# Patient Record
Sex: Female | Born: 1990 | Race: White | Hispanic: No | Marital: Married | Smoking: Never smoker
Health system: Southern US, Community
[De-identification: ages and names within clinical notes are randomized; demographics above are authoritative.]

## PROBLEM LIST (undated history)

## (undated) DIAGNOSIS — N200 Calculus of kidney: Secondary | ICD-10-CM

## (undated) DIAGNOSIS — O280 Abnormal hematological finding on antenatal screening of mother: Secondary | ICD-10-CM

## (undated) DIAGNOSIS — E119 Type 2 diabetes mellitus without complications: Secondary | ICD-10-CM

## (undated) DIAGNOSIS — O24419 Gestational diabetes mellitus in pregnancy, unspecified control: Secondary | ICD-10-CM

## (undated) HISTORY — DX: Type 2 diabetes mellitus without complications: E11.9

## (undated) HISTORY — PX: FRACTURE SURGERY: SHX138

## (undated) HISTORY — PX: CHOLECYSTECTOMY: SHX55

---

## 2014-04-06 ENCOUNTER — Other Ambulatory Visit: Payer: Self-pay

## 2014-04-08 ENCOUNTER — Other Ambulatory Visit (HOSPITAL_COMMUNITY): Payer: Self-pay | Admitting: Obstetrics and Gynecology

## 2014-04-08 DIAGNOSIS — R772 Abnormality of alphafetoprotein: Secondary | ICD-10-CM

## 2014-04-15 ENCOUNTER — Ambulatory Visit (HOSPITAL_COMMUNITY)
Admission: RE | Admit: 2014-04-15 | Discharge: 2014-04-15 | Disposition: A | Discharge status: Home / Self Care | Payer: Medicaid Other | Source: Ambulatory Visit | Attending: Obstetrics and Gynecology | Admitting: Obstetrics and Gynecology

## 2014-04-15 ENCOUNTER — Encounter (HOSPITAL_COMMUNITY): Payer: Self-pay

## 2014-04-15 VITALS — BP 123/62 | HR 73 | Wt 204.5 lb

## 2014-04-15 DIAGNOSIS — R772 Abnormality of alphafetoprotein: Secondary | ICD-10-CM

## 2014-04-15 DIAGNOSIS — IMO0002 Reserved for concepts with insufficient information to code with codable children: Secondary | ICD-10-CM | POA: Diagnosis present

## 2014-04-15 HISTORY — DX: Abnormal hematological finding on antenatal screening of mother: O28.0

## 2014-04-15 NOTE — Progress Notes (Addendum)
Genetic Counseling  High-Risk Gestation Note  Appointment Date:  04/15/2014 Referred By: Hassell Done, MD Date of Birth:  Oct 07, 1990    Pregnancy History: G2P1001 Estimated Date of Delivery: 09/02/14 Estimated Gestational Age: [redacted]w[redacted]d Attending: Alpha Gula, MD    Alexandria Price was seen for consultation for genetic counseling because of an elevated MSAFP of 3.11 MoMs based on maternal serum screening through [facility]. She was accompanied by her mother to today's visit.     We reviewed Alexandria Price's maternal serum screening result, the elevation of MSAFP, and the associated 1 in 73 risk for a fetal open neural tube defect.   We reviewed ONTDs, the typical multifactorial etiology, and variable prognosis.  In addition, we discussed additional explanations for an elevated MSAFP including: normal variation, twins, feto-maternal bleeding, a gestational dating error, abdominal wall defects, kidney differences, oligohydramnios, and placental problems.  We discussed that an unexplained elevation of MSAFP is associated with an increased risk for third trimester complications including: prematurity, low birth weight, and pre-eclampsia.    We reviewed additional available screening and diagnostic options including detailed ultrasound and amniocentesis.  We discussed the risks, limitations, and benefits of each.  After thoughtful consideration of these options, Ms. Alexandria Price elected to have ultrasound, but declined amniocentesis.  She understands that ultrasound cannot rule out all birth defects or genetic syndromes.  However, she was counseled that 90% of fetuses with ONTDs can be detected by detailed 2nd trimester ultrasound, when well visualized.  A complete ultrasound was performed today.  The ultrasound report will be sent under separate cover. Fetal spine was suboptimally visualized. Follow-up ultrasound was planned for 05/13/14 to complete fetal anatomic survey.   Alexandria Price was provided  with written information regarding cystic fibrosis (CF) including the carrier frequency and incidence in the Caucasian population, the availability of carrier testing and prenatal diagnosis if indicated.  In addition, we discussed that CF is routinely screened for as part of the [place] newborn screening panel.  She declined testing today.   Alexandria Price's family history was reviewed and found to be contributory for mild intellectual disabilities for the patient's maternal great-uncle (her maternal grandmother's brother) and for a female maternal first cousin once removed (her maternal grandmother's sister's daughter). The etiologies were not known for these individual's delays. The uncle passed away in his 51's. He reportedly lived independently and was employed. Limited information is known about the cousin. Ms. Alexandria Price was counseled that there are many different causes of intellectual disabilities including environmental, multifactorial, and genetic etiologies.  We discussed that a specific diagnosis for intellectual disability can be determined in approximately 50% of these individuals.  In the remaining 50% of individuals, a diagnosis may never be determined.  Regarding genetic causes, we discussed that chromosome aberrations (aneuploidy, deletions, duplications, insertions, and translocations) are responsible for a small percentage of individuals with intellectual disability.  Many individuals with chromosome aberrations have additional differences, including congenital anomalies or minor dysmorphisms.  Likewise, single gene conditions are the underlying cause of intellectual delay in some families.  We discussed that many gene conditions have intellectual disability as a feature, but also often include other physical or medical differences. We discussed that given the degree of relation and the large number of additional relatives without similar features, recurrence risk for the current pregnancy is  likely low. However, given that the etiology is not known, the patient understands that prenatal screening or testing would not be available at this time for  the specific intellectual disability in the family. We discussed that without more specific information, it is difficult to provide an accurate risk assessment.   Additionally, the patient reported that her maternal half-sister has a daughter with a condition that was inherited from the father (who is not related to Ms. Alexandria SimmondsAdair). They could not recall the name of the condition but reported that the father has various wart-like growths on his skin, which are not warts, and the daughter has to have regular CT scans to follow something on her brain. We discussed that this description could possibly fit with neurofibromatosis (NF), which is an autosomal dominant condition. If this is the case, the reported family history would not increase recurrence risk for the current pregnancy for NF. However, a different underlying etiology may alter recurrence risk assessment.  Further genetic counseling is warranted if more information is obtained.  Alexandria Price  was not familiar with the father of the baby's family history.  We, therefore, cannot comment on how his history might contribute to the overall chance for the baby to have a birth defect.  Without further information regarding the provided family history, an accurate genetic risk cannot be calculated. Further genetic counseling is warranted if more information is obtained.  Ms. Alexandria Price denied exposure to environmental toxins or chemical agents. She denied the use of alcohol, tobacco or street drugs. She denied significant viral illnesses during the course of her pregnancy. Her medical and surgical histories were noncontributory.   I counseled Ms. Jillyn Ledgerestiney D Price for approximately 25 minutes regarding the above risks and available options.    Quinn PlowmanKaren Kiesha Ensey, MS,  Certified [employer]or    04/15/2014

## 2014-05-13 ENCOUNTER — Encounter (HOSPITAL_COMMUNITY): Payer: Self-pay

## 2014-05-13 ENCOUNTER — Ambulatory Visit (HOSPITAL_COMMUNITY)
Admission: RE | Admit: 2014-05-13 | Discharge: 2014-05-13 | Disposition: A | Discharge status: Home / Self Care | Payer: Medicaid Other | Source: Ambulatory Visit | Attending: Obstetrics and Gynecology | Admitting: Obstetrics and Gynecology

## 2014-05-13 VITALS — BP 114/65 | HR 87 | Wt 214.0 lb

## 2014-05-13 DIAGNOSIS — R772 Abnormality of alphafetoprotein: Secondary | ICD-10-CM

## 2014-05-13 DIAGNOSIS — Z3689 Encounter for other specified antenatal screening: Secondary | ICD-10-CM | POA: Insufficient documentation

## 2014-05-13 DIAGNOSIS — O358XX0 Maternal care for other (suspected) fetal abnormality and damage, not applicable or unspecified: Secondary | ICD-10-CM | POA: Insufficient documentation

## 2014-05-13 DIAGNOSIS — O289 Unspecified abnormal findings on antenatal screening of mother: Secondary | ICD-10-CM | POA: Insufficient documentation

## 2014-05-13 DIAGNOSIS — O280 Abnormal hematological finding on antenatal screening of mother: Secondary | ICD-10-CM

## 2014-06-25 ENCOUNTER — Other Ambulatory Visit (HOSPITAL_COMMUNITY): Payer: Self-pay | Admitting: Obstetrics and Gynecology

## 2014-06-25 DIAGNOSIS — O09293 Supervision of pregnancy with other poor reproductive or obstetric history, third trimester: Secondary | ICD-10-CM

## 2014-06-25 DIAGNOSIS — O280 Abnormal hematological finding on antenatal screening of mother: Secondary | ICD-10-CM

## 2014-06-25 DIAGNOSIS — O99213 Obesity complicating pregnancy, third trimester: Secondary | ICD-10-CM

## 2014-07-01 ENCOUNTER — Encounter (HOSPITAL_COMMUNITY): Payer: Self-pay

## 2014-07-01 ENCOUNTER — Ambulatory Visit (HOSPITAL_COMMUNITY)
Admission: RE | Admit: 2014-07-01 | Discharge: 2014-07-01 | Disposition: A | Discharge status: Home / Self Care | Payer: Medicaid Other | Source: Ambulatory Visit | Attending: Obstetrics and Gynecology | Admitting: Obstetrics and Gynecology

## 2014-07-01 DIAGNOSIS — O99213 Obesity complicating pregnancy, third trimester: Secondary | ICD-10-CM

## 2014-07-01 DIAGNOSIS — O99210 Obesity complicating pregnancy, unspecified trimester: Secondary | ICD-10-CM | POA: Diagnosis not present

## 2014-07-01 DIAGNOSIS — O289 Unspecified abnormal findings on antenatal screening of mother: Secondary | ICD-10-CM | POA: Insufficient documentation

## 2014-07-01 DIAGNOSIS — Z3A00 Weeks of gestation of pregnancy not specified: Secondary | ICD-10-CM | POA: Insufficient documentation

## 2014-07-01 DIAGNOSIS — O09293 Supervision of pregnancy with other poor reproductive or obstetric history, third trimester: Secondary | ICD-10-CM | POA: Diagnosis not present

## 2014-07-01 DIAGNOSIS — O280 Abnormal hematological finding on antenatal screening of mother: Secondary | ICD-10-CM

## 2014-07-01 HISTORY — DX: Gestational diabetes mellitus in pregnancy, unspecified control: O24.419

## 2014-07-16 ENCOUNTER — Other Ambulatory Visit (HOSPITAL_COMMUNITY): Payer: Self-pay | Admitting: Obstetrics and Gynecology

## 2014-07-16 DIAGNOSIS — O352XX1 Maternal care for (suspected) hereditary disease in fetus, fetus 1: Secondary | ICD-10-CM

## 2014-07-16 DIAGNOSIS — O337XX0 Maternal care for disproportion due to other fetal deformities, not applicable or unspecified: Secondary | ICD-10-CM

## 2014-07-16 DIAGNOSIS — O280 Abnormal hematological finding on antenatal screening of mother: Secondary | ICD-10-CM

## 2014-07-16 DIAGNOSIS — O24410 Gestational diabetes mellitus in pregnancy, diet controlled: Secondary | ICD-10-CM

## 2014-07-16 DIAGNOSIS — O09293 Supervision of pregnancy with other poor reproductive or obstetric history, third trimester: Secondary | ICD-10-CM

## 2014-07-27 ENCOUNTER — Encounter (HOSPITAL_COMMUNITY): Payer: Self-pay

## 2014-08-05 ENCOUNTER — Ambulatory Visit (HOSPITAL_COMMUNITY)
Admission: RE | Admit: 2014-08-05 | Discharge: 2014-08-05 | Disposition: A | Discharge status: Home / Self Care | Payer: Medicaid Other | Source: Ambulatory Visit | Attending: Obstetrics and Gynecology | Admitting: Obstetrics and Gynecology

## 2014-08-05 ENCOUNTER — Encounter (HOSPITAL_COMMUNITY): Payer: Self-pay

## 2014-08-05 DIAGNOSIS — O3660X0 Maternal care for excessive fetal growth, unspecified trimester, not applicable or unspecified: Secondary | ICD-10-CM | POA: Insufficient documentation

## 2014-08-05 DIAGNOSIS — Z3A36 36 weeks gestation of pregnancy: Secondary | ICD-10-CM | POA: Diagnosis not present

## 2014-08-05 DIAGNOSIS — O99213 Obesity complicating pregnancy, third trimester: Secondary | ICD-10-CM | POA: Diagnosis not present

## 2014-08-05 DIAGNOSIS — O352XX1 Maternal care for (suspected) hereditary disease in fetus, fetus 1: Secondary | ICD-10-CM

## 2014-08-05 DIAGNOSIS — O280 Abnormal hematological finding on antenatal screening of mother: Secondary | ICD-10-CM

## 2014-08-05 DIAGNOSIS — O99210 Obesity complicating pregnancy, unspecified trimester: Secondary | ICD-10-CM | POA: Insufficient documentation

## 2014-08-05 DIAGNOSIS — O24410 Gestational diabetes mellitus in pregnancy, diet controlled: Secondary | ICD-10-CM | POA: Insufficient documentation

## 2014-08-05 DIAGNOSIS — O337XX0 Maternal care for disproportion due to other fetal deformities, not applicable or unspecified: Secondary | ICD-10-CM

## 2014-08-05 DIAGNOSIS — O24419 Gestational diabetes mellitus in pregnancy, unspecified control: Secondary | ICD-10-CM | POA: Insufficient documentation

## 2014-08-05 DIAGNOSIS — O09293 Supervision of pregnancy with other poor reproductive or obstetric history, third trimester: Secondary | ICD-10-CM

## 2015-02-18 ENCOUNTER — Encounter (HOSPITAL_COMMUNITY): Payer: Self-pay | Admitting: *Deleted

## 2016-07-24 DIAGNOSIS — S93401A Sprain of unspecified ligament of right ankle, initial encounter: Secondary | ICD-10-CM | POA: Insufficient documentation

## 2021-01-09 ENCOUNTER — Emergency Department (HOSPITAL_COMMUNITY)
Admission: EM | Admit: 2021-01-09 | Discharge: 2021-01-09 | Disposition: A | Discharge status: Home / Self Care | Payer: PRIVATE HEALTH INSURANCE | Attending: Emergency Medicine | Admitting: Emergency Medicine

## 2021-01-09 ENCOUNTER — Encounter (HOSPITAL_COMMUNITY): Payer: Self-pay | Admitting: *Deleted

## 2021-01-09 ENCOUNTER — Emergency Department (HOSPITAL_COMMUNITY): Payer: PRIVATE HEALTH INSURANCE

## 2021-01-09 ENCOUNTER — Other Ambulatory Visit: Payer: Self-pay

## 2021-01-09 DIAGNOSIS — M545 Low back pain, unspecified: Secondary | ICD-10-CM

## 2021-01-09 DIAGNOSIS — R10819 Abdominal tenderness, unspecified site: Secondary | ICD-10-CM | POA: Diagnosis not present

## 2021-01-09 DIAGNOSIS — R300 Dysuria: Secondary | ICD-10-CM | POA: Insufficient documentation

## 2021-01-09 HISTORY — DX: Calculus of kidney: N20.0

## 2021-01-09 LAB — COMPREHENSIVE METABOLIC PANEL
ALT: 18 U/L (ref 0–44)
AST: 17 U/L (ref 15–41)
Albumin: 4.1 g/dL (ref 3.5–5.0)
Alkaline Phosphatase: 66 U/L (ref 38–126)
Anion gap: 8 (ref 5–15)
BUN: 8 mg/dL (ref 6–20)
CO2: 24 mmol/L (ref 22–32)
Calcium: 9.2 mg/dL (ref 8.9–10.3)
Chloride: 104 mmol/L (ref 98–111)
Creatinine, Ser: 0.79 mg/dL (ref 0.44–1.00)
GFR, Estimated: >60 mL/min (ref >60)
Glucose, Bld: 114 mg/dL — ABNORMAL HIGH (ref 70–99)
Potassium: 3.7 mmol/L (ref 3.5–5.1)
Sodium: 136 mmol/L (ref 135–145)
Total Bilirubin: 0.2 mg/dL — ABNORMAL LOW (ref 0.3–1.2)
Total Protein: 8 g/dL (ref 6.5–8.1)

## 2021-01-09 LAB — URINALYSIS, ROUTINE W REFLEX MICROSCOPIC
Bilirubin Urine: NEGATIVE
Glucose, UA: NEGATIVE mg/dL
Hgb urine dipstick: NEGATIVE
Ketones, ur: NEGATIVE mg/dL
Leukocytes,Ua: NEGATIVE
Nitrite: NEGATIVE
Protein, ur: NEGATIVE mg/dL
Specific Gravity, Urine: 1.011 (ref 1.005–1.030)
pH: 6 (ref 5.0–8.0)

## 2021-01-09 LAB — CBC WITH DIFFERENTIAL/PLATELET
Abs Immature Granulocytes: 0.04 10*3/uL (ref 0.00–0.07)
Basophils Absolute: 0.1 10*3/uL (ref 0.0–0.1)
Basophils Relative: 1 %
Eosinophils Absolute: 0.1 10*3/uL (ref 0.0–0.5)
Eosinophils Relative: 1 %
HCT: 37.1 % (ref 36.0–46.0)
Hemoglobin: 11.1 g/dL — ABNORMAL LOW (ref 12.0–15.0)
Immature Granulocytes: 0 %
Lymphocytes Relative: 24 %
Lymphs Abs: 2.7 10*3/uL (ref 0.7–4.0)
MCH: 24.7 pg — ABNORMAL LOW (ref 26.0–34.0)
MCHC: 29.9 g/dL — ABNORMAL LOW (ref 30.0–36.0)
MCV: 82.4 fL (ref 80.0–100.0)
Monocytes Absolute: 0.5 10*3/uL (ref 0.1–1.0)
Monocytes Relative: 4 %
Neutro Abs: 7.9 10*3/uL — ABNORMAL HIGH (ref 1.7–7.7)
Neutrophils Relative %: 70 %
Platelets: 342 10*3/uL (ref 150–400)
RBC: 4.5 MIL/uL (ref 3.87–5.11)
RDW: 14.6 % (ref 11.5–15.5)
WBC: 11.2 10*3/uL — ABNORMAL HIGH (ref 4.0–10.5)
nRBC: 0 % (ref 0.0–0.2)

## 2021-01-09 LAB — I-STAT BETA HCG BLOOD, ED (MC, WL, AP ONLY): I-stat hCG, quantitative: <5 m[IU]/mL (ref <5)

## 2021-01-09 LAB — LIPASE, BLOOD: Lipase: 64 U/L — ABNORMAL HIGH (ref 11–51)

## 2021-01-09 MED ORDER — CYCLOBENZAPRINE HCL 10 MG PO TABS: 10.0000 mg | ORAL_TABLET | Freq: Two times a day (BID) | ORAL | 0 refills | Status: DC | PRN

## 2021-01-09 MED ORDER — FENTANYL CITRATE (PF) 100 MCG/2ML IJ SOLN
50.0000 ug | Freq: Once | INTRAMUSCULAR | Status: AC
  Administered 2021-01-09: 50 ug via INTRAVENOUS
  Filled 2021-01-09: qty 2

## 2021-01-09 MED ORDER — SODIUM CHLORIDE 0.9 % IV BOLUS
500.0000 mL | Freq: Once | INTRAVENOUS | Status: AC
  Administered 2021-01-09: 500 mL via INTRAVENOUS

## 2021-01-09 MED ORDER — METHYLPREDNISOLONE 4 MG PO TBPK: ORAL_TABLET | ORAL | 0 refills | Status: DC

## 2021-01-09 NOTE — Discharge Instructions (Signed)
Recommend 600 mg of ibuprofen every 8 hours for the next 5 days as needed for pain, recommend 1000 mg of Tylenol every 6 hours as needed for pain, take Medrol Dosepak as prescribed, take Flexeril as prescribed but do not mix with any other alcohol or drugs or heavy machinery use or driving as it can make you sleepy

## 2021-01-09 NOTE — ED Triage Notes (Signed)
Pt reports a couple of days of lower back pain and pain with urination. Became more severe today. Has urinary frequency. Hx of kidney stones.

## 2021-01-09 NOTE — ED Triage Notes (Signed)
Emergency Medicine Provider Triage Evaluation Note  Alexandria Price , a 30 y.o. female  was evaluated in triage.  Pt complains of midline lower back pain that started this morning. Reports dysuria for the last several days. Denies hematuria or fevers. Reports nausea, but denies vomiting.   Review of Systems  Positive: Back pain, dysuria, nausea Negative: Fevers, vomiting  Physical Exam  BP 125/79 (BP Location: Left Arm)   Pulse 72   Temp 99 F (37.2 C)   Resp 20   SpO2 100%  Gen:   Awake, no distress   HEENT:  Atraumatic  Resp:  Normal effort  Cardiac:  Normal rate  Abd:   Nondistended, bilat cva ttp and mild suprapubic ttp MSK:   Moves extremities without difficulty  Neuro:  Speech clear   Medical Decision Making  Medically screening exam initiated at 4:09 PM.  Appropriate orders placed.  Wendi Colello was informed that the remainder of the evaluation will be completed by another provider, this initial triage assessment does not replace that evaluation, and the importance of remaining in the ED until their evaluation is complete.  Clinical Impression    30 y/o f with back pain and urinary sxs  MSE was initiated and I personally evaluated the patient and placed orders (if any) at  4:17 PM on January 09, 2021.  The patient appears stable so that the remainder of the MSE may be completed by another provider.     Karrie Meres, [place] 01/09/21 1619

## 2021-01-09 NOTE — ED Provider Notes (Signed)
[facility] EMERGENCY DEPARTMENT Provider Note   CSN: [id number] Arrival date & time: 01/09/21  1531     History Chief Complaint  Patient presents with  . Back Pain    Sharra Kesinger is a 30 y.o. female.  The history is provided by the patient.  Back Pain Location:  Lumbar spine Quality:  Aching Pain severity:  Mild Onset quality:  Gradual Timing:  Constant Progression:  Unchanged Chronicity:  New Context comment:  Kidney stone history, low back pain and urinary symtpoms. Relieved by:  Nothing Worsened by:  Movement Associated symptoms: dysuria   Associated symptoms: no abdominal pain, no chest pain and no fever        Past Medical History:  Diagnosis Date  . Kidney stone     There are no problems to display for this patient.   History reviewed. No pertinent surgical history.   OB History   No obstetric history on file.     History reviewed. No pertinent family history.  Social History   Tobacco Use  . Smoking status: Never Smoker  Substance Use Topics  . Alcohol use: Never  . Drug use: Never    Home Medications Prior to Admission medications   Medication Sig Start Date End Date Taking? Authorizing Provider  cyclobenzaprine (FLEXERIL) 10 MG tablet Take 1 tablet (10 mg total) by mouth 2 (two) times daily as needed for up to 20 doses for muscle spasms. 01/09/21  Yes Mervil Wacker, DO  methylPREDNISolone (MEDROL DOSEPAK) 4 MG TBPK tablet Follow package insert 01/09/21  Yes Chrisette Man, DO    Allergies    Patient has no known allergies.  Review of Systems   Review of Systems  Constitutional: Negative for chills and fever.  HENT: Negative for ear pain and sore throat.   Eyes: Negative for pain and visual disturbance.  Respiratory: Negative for cough and shortness of breath.   Cardiovascular: Negative for chest pain and palpitations.  Gastrointestinal: Negative for abdominal pain and vomiting.  Genitourinary: Positive for  dysuria. Negative for hematuria.  Musculoskeletal: Positive for back pain. Negative for arthralgias.  Skin: Negative for color change and rash.  Neurological: Negative for seizures and syncope.  All other systems reviewed and are negative.   Physical Exam Updated Vital Signs BP 100/65 (BP Location: Right Arm)   Pulse 60   Temp 99 F (37.2 C) (Oral)   Resp 12   LMP 12/23/2020   SpO2 99%   Physical Exam Vitals and nursing note reviewed.  Constitutional:      General: She is not in acute distress.    Appearance: She is well-developed.  HENT:     Head: Normocephalic and atraumatic.  Eyes:     Conjunctiva/sclera: Conjunctivae normal.  Cardiovascular:     Rate and Rhythm: Normal rate and regular rhythm.     Heart sounds: No murmur heard.   Pulmonary:     Effort: Pulmonary effort is normal. No respiratory distress.     Breath sounds: Normal breath sounds.  Abdominal:     Palpations: Abdomen is soft.     Tenderness: There is no abdominal tenderness. There is right CVA tenderness and left CVA tenderness.  Musculoskeletal:     Cervical back: Neck supple.  Skin:    General: Skin is warm and dry.  Neurological:     General: No focal deficit present.     Mental Status: She is alert and oriented to person, place, and time.     Cranial  Nerves: No cranial nerve deficit.     Sensory: No sensory deficit.     Motor: No weakness.     Coordination: Coordination normal.     Comments: 5+ out of 5 strength throughout, normal sensation     ED Results / Procedures / Treatments   Labs (all labs ordered are listed, but only abnormal results are displayed) Labs Reviewed  COMPREHENSIVE METABOLIC PANEL - Abnormal; Notable for the following components:      Result Value   Glucose, Bld 114 (*)    Total Bilirubin 0.2 (*)    All other components within normal limits  LIPASE, BLOOD - Abnormal; Notable for the following components:   Lipase 64 (*)    All other components within normal  limits  CBC WITH DIFFERENTIAL/PLATELET - Abnormal; Notable for the following components:   WBC 11.2 (*)    Hemoglobin 11.1 (*)    MCH 24.7 (*)    MCHC 29.9 (*)    Neutro Abs 7.9 (*)    All other components within normal limits  URINALYSIS, ROUTINE W REFLEX MICROSCOPIC - Abnormal; Notable for the following components:   Color, Urine STRAW (*)    All other components within normal limits  I-STAT BETA HCG BLOOD, ED ([facility], [facility], [facility] ONLY)    EKG None  Radiology CT Renal Stone Study  Result Date: 01/09/2021 CLINICAL DATA:  Bilateral flank pain. EXAM: CT ABDOMEN AND PELVIS WITHOUT CONTRAST TECHNIQUE: Multidetector CT imaging of the abdomen and pelvis was performed following the standard protocol without IV contrast. COMPARISON:  CT abdomen pelvis 09/05/2020 FINDINGS: Lower chest: No acute abnormality. Hepatobiliary: No focal liver abnormality is seen. Status post cholecystectomy. No biliary dilatation. Pancreas: No focal lesion. Normal pancreatic contour. No surrounding inflammatory changes. No main pancreatic ductal dilatation. Spleen: Normal in size without focal abnormality. Adrenals/Urinary Tract: No adrenal nodule bilaterally. Few 1 mm calcified stones within the left kidney. No right nephrolithiasis. No hydronephrosis. No contour-deforming renal mass. No ureterolithiasis or hydroureter. The urinary bladder is unremarkable. Stomach/Bowel: Stomach is within normal limits. No evidence of bowel wall thickening or dilatation. Appendix appears normal. Vascular/Lymphatic: No significant vascular findings are present. No enlarged abdominal or pelvic lymph nodes. Reproductive: Findings suggestive of tubal ligation bilaterally. Uterus and bilateral adnexa are unremarkable. Other: No intraperitoneal free fluid. No intraperitoneal free gas. No organized fluid collection. Musculoskeletal: No acute or significant osseous findings. IMPRESSION: 1. Nonobstructive punctate left nephrolithiasis. 2. Otherwise no acute  intra-abdominal intrapelvic abnormality in a patient status post cholecystectomy and likely bilateral tubal ligation. Please note limited evaluation on this noncontrast study. Electronically Signed   By: Tish Frederickson M.D.   On: 01/09/2021 20:44    Procedures Procedures   Medications Ordered in ED Medications  fentaNYL (SUBLIMAZE) injection 50 mcg (50 mcg Intravenous Given 01/09/21 1940)  sodium chloride 0.9 % bolus 500 mL (0 mLs Intravenous Stopped 01/09/21 2032)    ED Course  I have reviewed the triage vital signs and the nursing notes.  Pertinent labs & imaging results that were available during my care of the patient were reviewed by me and considered in my medical decision making (see chart for details).    MDM Rules/Calculators/A&P                          Annamae Hirschi is here with bilateral back pain.  History of kidney stone.  Normal vitals.  No fever.  Having urinary symptoms as well.  Denies any  bleeding.  CT scan negative for kidney stone.  Urinalysis negative for infection.  Overall no significant anemia, electrolyte abdomen, kidney injury.  Overall suspect muscle spasm.  Prescribed steroids, Flexeril.  Pregnancy test was negative.  Given return precautions and discharged from the ED in good condition.  No symptoms of cauda equina.  No loss of bowel or bladder.  No urinary retention.  Neurologically intact.  This chart was dictated using voice recognition software.  Despite best efforts to proofread,  errors can occur which can change the documentation meaning.    Final Clinical Impression(s) / ED Diagnoses Final diagnoses:  Acute bilateral low back pain without sciatica    Rx / DC Orders ED Discharge Orders         Ordered    methylPREDNISolone (MEDROL DOSEPAK) 4 MG TBPK tablet        01/09/21 2137    cyclobenzaprine (FLEXERIL) 10 MG tablet  2 times daily PRN        01/09/21 2137           Virgina Norfolk, DO 01/09/21 2139

## 2021-04-05 DIAGNOSIS — M216X1 Other acquired deformities of right foot: Secondary | ICD-10-CM | POA: Insufficient documentation

## 2021-10-25 DIAGNOSIS — D500 Iron deficiency anemia secondary to blood loss (chronic): Secondary | ICD-10-CM | POA: Insufficient documentation

## 2021-10-25 DIAGNOSIS — D539 Nutritional anemia, unspecified: Secondary | ICD-10-CM | POA: Insufficient documentation

## 2021-11-03 ENCOUNTER — Other Ambulatory Visit: Payer: Self-pay

## 2021-11-05 ENCOUNTER — Other Ambulatory Visit: Payer: Self-pay | Admitting: Oncology

## 2021-11-05 DIAGNOSIS — D539 Nutritional anemia, unspecified: Secondary | ICD-10-CM

## 2021-11-06 ENCOUNTER — Encounter: Payer: Self-pay | Admitting: Oncology

## 2021-11-06 ENCOUNTER — Other Ambulatory Visit: Payer: Self-pay | Admitting: Oncology

## 2021-11-06 ENCOUNTER — Telehealth: Payer: Self-pay | Admitting: Oncology

## 2021-11-06 ENCOUNTER — Inpatient Hospital Stay (INDEPENDENT_AMBULATORY_CARE_PROVIDER_SITE_OTHER): Payer: Medicaid Other | Admitting: Oncology

## 2021-11-06 ENCOUNTER — Inpatient Hospital Stay: Payer: Medicaid Other | Attending: Oncology

## 2021-11-06 ENCOUNTER — Other Ambulatory Visit: Payer: Self-pay

## 2021-11-06 VITALS — BP 131/76 | HR 68 | Temp 98.4°F | Resp 18 | Ht 62.25 in | Wt 251.7 lb

## 2021-11-06 DIAGNOSIS — Z79899 Other long term (current) drug therapy: Secondary | ICD-10-CM | POA: Diagnosis not present

## 2021-11-06 DIAGNOSIS — D508 Other iron deficiency anemias: Secondary | ICD-10-CM | POA: Insufficient documentation

## 2021-11-06 DIAGNOSIS — D539 Nutritional anemia, unspecified: Secondary | ICD-10-CM

## 2021-11-06 DIAGNOSIS — D500 Iron deficiency anemia secondary to blood loss (chronic): Secondary | ICD-10-CM

## 2021-11-06 LAB — RETICULOCYTES
Immature Retic Fract: 15.9 % (ref 2.3–15.9)
RBC.: 4.3 MIL/uL (ref 3.87–5.11)
Retic Count, Absolute: 64.9 10*3/uL (ref 19.0–186.0)
Retic Ct Pct: 1.5 % (ref 0.4–3.1)

## 2021-11-06 LAB — FOLATE: Folate: 7.6 ng/mL (ref >5.9)

## 2021-11-06 LAB — CBC AND DIFFERENTIAL
HCT: 35 — AB (ref 36–46)
Hemoglobin: 11.2 — AB (ref 12.0–16.0)
Neutrophils Absolute: 7.99
Platelets: 293 (ref 150–399)
WBC: 10.8

## 2021-11-06 LAB — CBC: RBC: 4.42 (ref 3.87–5.11)

## 2021-11-06 LAB — LACTATE DEHYDROGENASE: LDH: 115 U/L (ref 98–192)

## 2021-11-06 LAB — IRON AND TIBC
Iron: 33 ug/dL (ref 28–170)
Saturation Ratios: 8 % — ABNORMAL LOW (ref 10.4–31.8)
TIBC: 431 ug/dL (ref 250–450)
UIBC: 398 ug/dL

## 2021-11-06 LAB — VITAMIN B12: Vitamin B-12: 213 pg/mL (ref 180–914)

## 2021-11-06 LAB — FERRITIN: Ferritin: 14 ng/mL (ref 11–307)

## 2021-11-06 NOTE — Progress Notes (Signed)
[facility]  [address] [place],  [place]  [address] [phone]  Clinic Day:  11/06/2021  Referring physician: [place]*  This document serves as a record of services personally performed by Hosie Poisson, MD. It was created on their behalf by Curry,Lauren E, a trained medical scribe. The creation of this record is based on the scribe's personal observations and the provider's statements to them.  ASSESSMENT & PLAN:   Iron deficiency anemia likely related to her menstrual periods. She has recently started oral iron supplement daily and has tolerated this without difficulty. Her hemoglobin has mildly improved today. I will evaluate for other evidence of nutritional deficiency with B12 and folate. I will also check for hemolysis.   Abdominal pain likely due to tiny submillimeter renal stones as seen on CT imaging.  This is a pleasant 31 year old female with iron deficiency anemia. This is likely related to her menstrual periods. She has started oral iron supplement daily with mild improvement in her hemoglobin to 11.2. For completeness, iron studies have been repeated and B12 and folate have been ordered to rule out other evidence of nutritional deficiency, and will check for hemolysis. She knows to continue oral iron daily. I also gave her a list of iron rich foods to include in her diet as well. Otherwise, we will plan to see her back in 1 month with CBC, CMP and iron studies for repeat evaluation. If all is well at that appointment, we can release her care back to her primary care physician. If she is unable to tolerate oral iron, we can consider IV supplement. If her iron deficiency persists after 6 months of supplementation, or if she has increase in upper abdominal symptoms, she may require upper endoscopy. She understands and agrees with this plan of care. I have answered her questions and she knows to call with any concerns.  Thank  you for the opportunity to participate in the care of your patients  I provided 30 minutes of face-to-face time during this this encounter and > 50% was spent counseling as documented under my assessment and plan.    Derwood Kaplan, MD [facility] [address] [place] [address] Dept: [phone] Dept Fax: [phone]    CHIEF COMPLAINT:  CC: Iron deficiency anemia  Current Treatment:  Oral iron supplement   HISTORY OF PRESENT ILLNESS:  Alexandria Price is a 31 y.o. female referred by Heide Scales, NP-C, for the evaluation and treatment of iron deficiency anemia. Lab evaluation from February 1st revealed a hemoglobin of 10.7 with an MCV of 79.6, and white count and platelets were normal. Iron studies revealed an iron of 31 with a TIBC of 406 for a saturation of 8%. Chemistries were unremarkable. She was recommended to start oral iron supplement and she has started this once daily, likely for 1 week.  INTERVAL HISTORY:  I have reviewed her chart and materials related to her cancer extensively and collaborated history with the patient. Summary of oncologic history is as follows: Oncology History   No history exists.    Alexandria Price states that she is tolerating oral iron supplement without difficulty. She has regular menstrual periods, which can occasionally be heavy. She notes pica to ice. She denies epistaxis, bleeding of the gums, melena, hematuria or other forms of overt bleeding. She does note easy bruising. She denies liver disease. She does note pain of the abdomen, which  she was told was due to kidney stones. CT abdomen and pelvis revealed possible tiny submillimeter renal stones. She has a history of UTI's and recently completed a course of Macrobid. She also has a history of ovarian cyst. Today her hemoglobin is 11.2 with an MCV of 79, and white count and platelets are normal. Her  appetite is good, and she is  eating well. She denies any significant unintentional weight loss or gain.  She denies fever, chills or other signs of infection.  She denies nausea, vomiting, bowel issues, or abdominal pain.  She denies sore throat, cough, dyspnea, or chest pain. She started menarche at age 39. She has never been placed on hormones.  HISTORY:   Past Medical History:  Diagnosis Date   Abnormal antenatal AFP screen    Diabetes mellitus without complication ([place])    Kidney stone     Past Surgical History:  Procedure Laterality Date   CHOLECYSTECTOMY     FRACTURE SURGERY      Family History  Problem Relation Age of Onset   Diabetes Mother    Diabetes Maternal Uncle     Social History:  reports that she has never smoked. She has never used smokeless tobacco. She reports that she does not drink alcohol and does not use drugs.The patient is alone today. She is married and lives at home with her spouse. She has 2 children. 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  Allergies: No Known Allergies  Current Medications: Current Outpatient Medications  Medication Sig Dispense Refill   tamsulosin (FLOMAX) 0.4 MG CAPS capsule Take 0.4 mg by mouth daily.     No current facility-administered medications for this visit.    REVIEW OF SYSTEMS:  Review of Systems  Constitutional:  Positive for fatigue (chronic). Negative for appetite change, chills, fever and unexpected weight change.       Pica to ice  HENT:  Negative.    Eyes: Negative.   Respiratory: Negative.  Negative for chest tightness, cough, hemoptysis, shortness of breath and wheezing.   Cardiovascular: Negative.  Negative for chest pain, leg swelling and palpitations.  Gastrointestinal:  Positive for abdominal pain. Negative for abdominal distention, blood in stool, constipation, diarrhea, nausea and vomiting.  Endocrine: Negative.   Genitourinary: Negative.  Negative for difficulty urinating,  dysuria, frequency and hematuria.   Musculoskeletal: Negative.  Negative for arthralgias, back pain, flank pain, gait problem and myalgias.  Skin: Negative.   Neurological: Negative.  Negative for dizziness, extremity weakness, gait problem, headaches, light-headedness, numbness, seizures and speech difficulty.  Hematological:  Bruises/bleeds easily.  Psychiatric/Behavioral: Negative.  Negative for depression and sleep disturbance. The patient is not nervous/anxious.     VITALS:  Blood pressure 131/76, pulse 68, temperature 98.4 F (36.9 C), temperature source Oral, resp. rate 18, height 5' 2.25" (1.581 m), weight 251 lb 11.2 oz (114.2 kg), SpO2 100 %, unknown if currently breastfeeding.  Wt Readings from Last 3 Encounters:  11/06/21 251 lb 11.2 oz (114.2 kg)  08/05/14 248 lb (112.5 kg)  07/01/14 226 lb (102.5 kg)    Body mass index is 45.67 kg/m.  Performance status (ECOG): 1 - Symptomatic but completely ambulatory  PHYSICAL EXAM:  Physical Exam Constitutional:      General: She is not in acute distress.    Appearance: Normal appearance. She is normal weight.  HENT:     Head: Normocephalic and atraumatic.  Eyes:     General: No  scleral icterus.    Extraocular Movements: Extraocular movements intact.     Conjunctiva/sclera: Conjunctivae normal.     Pupils: Pupils are equal, round, and reactive to light.  Cardiovascular:     Rate and Rhythm: Normal rate and regular rhythm.     Pulses: Normal pulses.     Heart sounds: Normal heart sounds. No murmur heard.   No friction rub. No gallop.  Pulmonary:     Effort: Pulmonary effort is normal. No respiratory distress.     Breath sounds: Normal breath sounds.  Abdominal:     General: Bowel sounds are normal. There is no distension.     Palpations: Abdomen is soft. There is no hepatomegaly, splenomegaly or mass.     Tenderness: There is abdominal tenderness (mild) in the epigastric area.  Musculoskeletal:        General: Normal range  of motion.     Cervical back: Normal range of motion and neck supple.     Right lower leg: No edema.     Left lower leg: No edema.  Lymphadenopathy:     Cervical: No cervical adenopathy.  Skin:    General: Skin is warm and dry.  Neurological:     General: No focal deficit present.     Mental Status: She is alert and oriented to person, place, and time. Mental status is at baseline.  Psychiatric:        Mood and Affect: Mood normal.        Behavior: Behavior normal.        Thought Content: Thought content normal.        Judgment: Judgment normal.     LABS:   CBC Latest Ref Rng & Units 11/06/2021 01/09/2021  WBC - 10.8 11.2(H)  Hemoglobin 12.0 - 16.0 11.2(A) 11.1(L)  Hematocrit 36 - 46 35(A) 37.1  Platelets 150 - 399 293 342   CMP Latest Ref Rng & Units 01/09/2021  Glucose 70 - 99 mg/dL 114(H)  BUN 6 - 20 mg/dL 8  Creatinine 0.44 - 1.00 mg/dL 0.79  Sodium 135 - 145 mmol/L 136  Potassium 3.5 - 5.1 mmol/L 3.7  Chloride 98 - 111 mmol/L 104  CO2 22 - 32 mmol/L 24  Calcium 8.9 - 10.3 mg/dL 9.2  Total Protein 6.5 - 8.1 g/dL 8.0  Total Bilirubin 0.3 - 1.2 mg/dL 0.2(L)  Alkaline Phos 38 - 126 U/L 66  AST 15 - 41 U/L 17  ALT 0 - 44 U/L 18    No results found for: TIBC, FERRITIN, IRONPCTSAT No results found for: LDH  STUDIES:  No results found.     EXAM: 10/26/2021 CT ABDOMEN AND PELVIS WITHOUT CONTRAST   TECHNIQUE:  Multidetector CT imaging of the abdomen and pelvis was performed  following the standard protocol without IV contrast.  RADIATION DOSE REDUCTION: This exam was performed according to the  departmental dose-optimization program which includes automated  exposure control, adjustment of the mA and/or kV according to  patient size and/or use of iterative reconstruction technique.   COMPARISON: 01/09/2021   FINDINGS:  Lower chest: Unremarkable.  Hepatobiliary: Liver is enlarged measuring 19.5 cm in length. No  focal abnormality is seen. Surgical clips are  seen in gallbladder  fossa.  Pancreas: No focal abnormality is seen.  Spleen: Unremarkable.  Adrenals/Urinary Tract: Adrenals are not enlarged. There is no  hydronephrosis. In the coronal reconstruction images there are few  tiny 1 mm calculi in the left kidney. There is possible sub mm  calcific density in the lower pole of right kidney. There are no  ureteral stones. Urinary bladder is not distended.  Stomach/Bowel: Stomach is unremarkable. Small bowel loops are not  dilated. Appendix is not distinctly seen. There is no evidence of  dilated appendix in the pericecal region. There is no focal  pericecal inflammation. There is no significant wall thickening in  colon.  Vascular/Lymphatic: Unremarkable.  Reproductive: Micro inserts are noted suggesting previous Essure  placement.  Other: There is no ascites or pneumoperitoneum. Small umbilical  hernia containing fat is seen.  Musculoskeletal: Unremarkable.   IMPRESSION:  There is no evidence intestinal obstruction or pneumoperitoneum.  There is no hydronephrosis. There are possible tiny submillimeter  renal stones.    I, Rita Ohara, am acting as scribe for Derwood Kaplan, MD  I have reviewed this report as typed by the medical scribe, and it is complete and accurate.

## 2021-11-06 NOTE — Telephone Encounter (Signed)
Per 11/06/21 los next appt scheduled and confirmed with patient °

## 2021-11-07 ENCOUNTER — Encounter: Payer: PRIVATE HEALTH INSURANCE | Admitting: Hematology and Oncology

## 2021-11-07 ENCOUNTER — Other Ambulatory Visit: Payer: PRIVATE HEALTH INSURANCE

## 2021-11-07 LAB — HAPTOGLOBIN: Haptoglobin: 249 mg/dL (ref 33–278)

## 2021-11-13 ENCOUNTER — Other Ambulatory Visit: Payer: Self-pay | Admitting: Oncology

## 2021-11-13 DIAGNOSIS — D500 Iron deficiency anemia secondary to blood loss (chronic): Secondary | ICD-10-CM

## 2021-11-14 ENCOUNTER — Telehealth: Payer: Self-pay

## 2021-11-14 NOTE — Telephone Encounter (Signed)
-----   Message from Dellia Beckwith, MD sent at 11/13/2021  8:09 PM EST ----- Regarding: call Tell her rest of labs okay but her B12 level is low normal, I rec 500 mcg po daily & we'll recheck when she returns

## 2021-11-14 NOTE — Telephone Encounter (Signed)
Patient notified

## 2021-12-17 NOTE — Progress Notes (Signed)
?[facility]  ?[address] ?[place],  [place]  [address] ?[phone] ? ?Clinic Day:  12/18/21 ? ?Referring physician: [facility]* ? ? ?ASSESSMENT & PLAN:  ? ?Iron deficiency anemia likely related to her menstrual periods. She has been taking oral iron supplement daily and has not tolerated this very well.  Her hemoglobin has mildly improved today. I will evaluate for other evidence of nutritional deficiency with B12 and folate.  Tests for hemolysis were negative.  ? ?Low normal B12 of 213 at previous visit.  I recommended oral supplement.  Repeat B12 level is up to 740 today. ? ?This is a pleasant 31 year old female with iron deficiency anemia. This is likely related to her menstrual periods. She has been taking oral iron supplement daily with decrease in her hemoglobin from 11.2 to 10.2. For completeness, iron studies have been repeated and B12 has been repeated.  Since her hemoglobin is dropping despite oral supplement, I recommend that we consider IV supplement.  I will make the arrangements once we confirm that she is not pregnant.  I recommend she return in 2 months with repeat CBC, B12 level, iron, TIBC and ferritin.  She understands and agrees with this plan of care. I have answered her questions and she knows to call with any concerns. ? ?Thank you for the opportunity to participate in the care of your patients ? ?I provided 20 minutes of face-to-face time during this this encounter and > 50% was spent counseling as documented under my assessment and plan.  ? ? ?Derwood Kaplan, MD ?[facility] ?[place] ?[place] [address] ?Dept: [phone] ?Dept Fax: [phone]  ? ? ?CHIEF COMPLAINT:  ?CC: Iron deficiency anemia ? ?Current Treatment:  IV iron supplement ? ? ?HISTORY OF PRESENT ILLNESS:  ?Alexandria Price is a 31 y.o. female referred by Heide Scales, NP-C, for the evaluation and  treatment of iron deficiency anemia. Lab evaluation from February 1st revealed a hemoglobin of 10.7 with an MCV of 79.6, and white count and platelets were normal. Iron studies revealed an iron of 31 with a TIBC of 406 for a saturation of 8%.When I saw her 2 weeks later, her hemoglobin was 11.2 with an MCV of 79. She was recommended to start oral iron supplement and she has started this once daily.  She has regular menstrual periods which can occasionally be heavy.  She noted pica to ice.  She denied any other forms of bleeding but does complain of easy bruising.  She has had some abdominal pain and CT scan of abdomen and pelvis revealed tiny renal stones but no other abnormalities. ? ?INTERVAL HISTORY:  ?I have reviewed her chart and materials related to her anemia extensively and collaborated the history with the patient.  ?Oncology History  ? No history exists.  ? ? ?Alexandria Price states that she is tolerating oral iron supplement but her hemoglobin has dropped from 11.2 to 10.2 with an MCV of 79, and white count and platelets are normal.  She has asked me to check a pregnancy test as she recently took 1 at home and it was positive but then a second 1 was negative.  She had only a light.  Last week and has been nauseated.  The repeat pregnancy test here was negative.  I had recommended that she take oral B12 supplement also since her B12 level was low normal at 213, and it  is now up to 740.  She still appears to be iron deficient. Her  appetite is good, and she is eating well. She denies any significant unintentional weight loss or gain.  She denies fever, chills or other signs of infection.  She denies nausea, vomiting, bowel issues, or abdominal pain.  She denies sore throat, cough, dyspnea, or chest pain. ?HISTORY:  ? ?Past Medical History:  ?Diagnosis Date  ? Abnormal antenatal AFP screen   ? Diabetes mellitus without complication ([place])   ? Kidney stone   ? ? ?Past Surgical History:  ?Procedure Laterality Date  ?  CHOLECYSTECTOMY    ? FRACTURE SURGERY    ? ? ?Family History  ?Problem Relation Age of Onset  ? Diabetes Mother   ? Diabetes Maternal Uncle   ? ? ?Social History:  reports that she has never smoked. She has never used smokeless tobacco. She reports that she does not drink alcohol and does not use drugs.The patient is alone today. She is married and lives at home with her spouse. She has 2 children. 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 ? ?Allergies: No Known Allergies ? ?Current Medications: ?Current Outpatient Medications  ?Medication Sig Dispense Refill  ? ondansetron (ZOFRAN-ODT) 4 MG disintegrating tablet Take 4 mg by mouth every 6 (six) hours as needed.    ? ?No current facility-administered medications for this visit.  ? ? ?REVIEW OF SYSTEMS:  ?Review of Systems  ?Constitutional:  Positive for fatigue (chronic). Negative for appetite change, chills, fever and unexpected weight change.  ?     Pica to ice  ?HENT:  Negative.    ?Eyes: Negative.   ?Respiratory: Negative.  Negative for chest tightness, cough, hemoptysis, shortness of breath and wheezing.   ?Cardiovascular: Negative.  Negative for chest pain, leg swelling and palpitations.  ?Gastrointestinal:  Negative for abdominal distention, abdominal pain, blood in stool, constipation, diarrhea, nausea and vomiting.  ?Endocrine: Negative.   ?Genitourinary: Negative.  Negative for difficulty urinating, dysuria, frequency and hematuria.   ?Musculoskeletal: Negative.  Negative for arthralgias, back pain, flank pain, gait problem and myalgias.  ?Skin: Negative.   ?Neurological: Negative.  Negative for dizziness, extremity weakness, gait problem, headaches, light-headedness, numbness, seizures and speech difficulty.  ?Hematological:  Bruises/bleeds easily.  ?Psychiatric/Behavioral: Negative.  Negative for depression and sleep disturbance. The patient is not nervous/anxious.    ? ?VITALS:  ?Blood pressure 122/83,  pulse 68, temperature 98.2 ?F (36.8 ?C), temperature source Oral, resp. rate 18, height 5\' 2"  (1.575 m), weight 250 lb 12.8 oz (113.8 kg), SpO2 98 %, unknown if currently breastfeeding.  ?Wt Readings from Last 3 Encounters:  ?12/28/21 249 lb 4 oz (113.1 kg)  ?12/21/21 251 lb (113.9 kg)  ?12/18/21 250 lb 12.8 oz (113.8 kg)  ?  ?Body mass index is 45.87 kg/m?. ? ?Performance status (ECOG): 1 - Symptomatic but completely ambulatory ? ?PHYSICAL EXAM:  ?Physical Exam ?Constitutional:   ?   General: She is not in acute distress. ?   Appearance: Normal appearance. She is normal weight.  ?HENT:  ?   Head: Normocephalic and atraumatic.  ?Eyes:  ?   General: No scleral icterus. ?   Extraocular Movements: Extraocular movements intact.  ?   Conjunctiva/sclera: Conjunctivae normal.  ?   Pupils: Pupils are equal, round, and reactive to light.  ?Cardiovascular:  ?   Rate and Rhythm: Normal rate and regular rhythm.  ?   Pulses: Normal pulses.  ?  Heart sounds: Normal heart sounds. No murmur heard. ?  No friction rub. No gallop.  ?Pulmonary:  ?   Effort: Pulmonary effort is normal. No respiratory distress.  ?   Breath sounds: Normal breath sounds.  ?Abdominal:  ?   General: Bowel sounds are normal. There is no distension.  ?   Palpations: Abdomen is soft. There is no hepatomegaly, splenomegaly or mass.  ?   Tenderness: There is abdominal tenderness (mild) in the epigastric area.  ?Musculoskeletal:     ?   General: Normal range of motion.  ?   Cervical back: Normal range of motion and neck supple.  ?   Right lower leg: No edema.  ?   Left lower leg: No edema.  ?Lymphadenopathy:  ?   Cervical: No cervical adenopathy.  ?Skin: ?   General: Skin is warm and dry.  ?Neurological:  ?   General: No focal deficit present.  ?   Mental Status: She is alert and oriented to person, place, and time. Mental status is at baseline.  ?Psychiatric:     ?   Mood and Affect: Mood normal.     ?   Behavior: Behavior normal.     ?   Thought Content:  Thought content normal.     ?   Judgment: Judgment normal.  ? ? ? ?LABS:  ? ? ?  Latest Ref Rng & Units 12/18/2021  ? 12:00 AM 11/06/2021  ? 12:00 AM 01/09/2021  ?  4:18 PM  ?CBC  ?WBC  9.7      10.8   11.2    ?

## 2021-12-18 ENCOUNTER — Telehealth: Payer: Self-pay | Admitting: Oncology

## 2021-12-18 ENCOUNTER — Inpatient Hospital Stay: Payer: Medicaid Other

## 2021-12-18 ENCOUNTER — Inpatient Hospital Stay: Payer: Medicaid Other | Attending: Oncology | Admitting: Oncology

## 2021-12-18 ENCOUNTER — Other Ambulatory Visit: Payer: Self-pay | Admitting: Oncology

## 2021-12-18 ENCOUNTER — Encounter: Payer: Self-pay | Admitting: Oncology

## 2021-12-18 ENCOUNTER — Other Ambulatory Visit: Payer: Self-pay

## 2021-12-18 VITALS — BP 122/83 | HR 68 | Temp 98.2°F | Resp 18 | Ht 62.0 in | Wt 250.8 lb

## 2021-12-18 DIAGNOSIS — D539 Nutritional anemia, unspecified: Secondary | ICD-10-CM

## 2021-12-18 DIAGNOSIS — D500 Iron deficiency anemia secondary to blood loss (chronic): Secondary | ICD-10-CM

## 2021-12-18 DIAGNOSIS — D508 Other iron deficiency anemias: Secondary | ICD-10-CM | POA: Insufficient documentation

## 2021-12-18 LAB — VITAMIN B12: Vitamin B-12: 740 pg/mL (ref 180–914)

## 2021-12-18 LAB — IRON AND TIBC
Iron: 73 ug/dL (ref 28–170)
Saturation Ratios: 19 % (ref 10.4–31.8)
TIBC: 378 ug/dL (ref 250–450)
UIBC: 305 ug/dL

## 2021-12-18 LAB — FERRITIN: Ferritin: 28 ng/mL (ref 11–307)

## 2021-12-18 LAB — CBC: RBC: 4.08 (ref 3.87–5.11)

## 2021-12-18 LAB — CBC AND DIFFERENTIAL
HCT: 32 — AB (ref 36–46)
Hemoglobin: 10.2 — AB (ref 12.0–16.0)
Neutrophils Absolute: 7.18
Platelets: 307 10*3/uL (ref 150–400)
WBC: 9.7

## 2021-12-18 LAB — HCG, SERUM, QUALITATIVE: Preg, Serum: NEGATIVE

## 2021-12-18 NOTE — Telephone Encounter (Signed)
Next appts per LOS has been scheduled along with IV Iron. Pt was called and notified of all appt dates and times. ?

## 2021-12-20 ENCOUNTER — Other Ambulatory Visit: Payer: Self-pay | Admitting: Pharmacist

## 2021-12-21 ENCOUNTER — Inpatient Hospital Stay: Payer: Medicaid Other

## 2021-12-21 VITALS — BP 138/84 | HR 63 | Temp 98.1°F | Resp 18 | Ht 62.0 in | Wt 251.0 lb

## 2021-12-21 DIAGNOSIS — D500 Iron deficiency anemia secondary to blood loss (chronic): Secondary | ICD-10-CM

## 2021-12-21 DIAGNOSIS — D508 Other iron deficiency anemias: Secondary | ICD-10-CM | POA: Diagnosis not present

## 2021-12-21 LAB — METHYLMALONIC ACID, SERUM: Methylmalonic Acid, Quantitative: 95 nmol/L (ref 0–378)

## 2021-12-21 MED ORDER — SODIUM CHLORIDE 0.9 % IV SOLN
200.0000 mg | Freq: Once | INTRAVENOUS | Status: AC
  Administered 2021-12-21: 200 mg via INTRAVENOUS
  Filled 2021-12-21: qty 200

## 2021-12-21 MED ORDER — SODIUM CHLORIDE 0.9 % IV SOLN: Freq: Once | INTRAVENOUS | Status: AC

## 2021-12-21 NOTE — Patient Instructions (Signed)

## 2021-12-28 ENCOUNTER — Inpatient Hospital Stay: Payer: Medicaid Other | Attending: Oncology

## 2021-12-28 VITALS — BP 104/67 | HR 74 | Temp 98.4°F | Resp 18 | Ht 62.0 in | Wt 249.2 lb

## 2021-12-28 DIAGNOSIS — D508 Other iron deficiency anemias: Secondary | ICD-10-CM | POA: Insufficient documentation

## 2021-12-28 DIAGNOSIS — D500 Iron deficiency anemia secondary to blood loss (chronic): Secondary | ICD-10-CM

## 2021-12-28 MED ORDER — SODIUM CHLORIDE 0.9 % IV SOLN: Freq: Once | INTRAVENOUS | Status: AC

## 2021-12-28 MED ORDER — SODIUM CHLORIDE 0.9 % IV SOLN
200.0000 mg | Freq: Once | INTRAVENOUS | Status: AC
  Administered 2021-12-28: 200 mg via INTRAVENOUS
  Filled 2021-12-28: qty 200

## 2021-12-28 NOTE — Patient Instructions (Signed)

## 2021-12-29 ENCOUNTER — Encounter: Payer: Self-pay | Admitting: Oncology

## 2022-01-01 ENCOUNTER — Encounter: Payer: Self-pay | Admitting: Oncology

## 2022-01-01 ENCOUNTER — Ambulatory Visit: Payer: PRIVATE HEALTH INSURANCE

## 2022-01-02 ENCOUNTER — Ambulatory Visit: Payer: PRIVATE HEALTH INSURANCE

## 2022-01-03 ENCOUNTER — Inpatient Hospital Stay: Payer: Medicaid Other

## 2022-01-03 VITALS — BP 123/80 | HR 71 | Temp 98.0°F | Resp 18 | Wt 252.1 lb

## 2022-01-03 DIAGNOSIS — D508 Other iron deficiency anemias: Secondary | ICD-10-CM | POA: Diagnosis not present

## 2022-01-03 DIAGNOSIS — D500 Iron deficiency anemia secondary to blood loss (chronic): Secondary | ICD-10-CM

## 2022-01-03 MED ORDER — SODIUM CHLORIDE 0.9 % IV SOLN: Freq: Once | INTRAVENOUS | Status: AC

## 2022-01-03 MED ORDER — SODIUM CHLORIDE 0.9 % IV SOLN
200.0000 mg | Freq: Once | INTRAVENOUS | Status: AC
  Administered 2022-01-03: 200 mg via INTRAVENOUS
  Filled 2022-01-03: qty 200

## 2022-01-03 NOTE — Patient Instructions (Signed)

## 2022-01-04 ENCOUNTER — Encounter: Payer: Self-pay | Admitting: Oncology

## 2022-01-10 ENCOUNTER — Inpatient Hospital Stay: Payer: Medicaid Other

## 2022-01-10 DIAGNOSIS — D500 Iron deficiency anemia secondary to blood loss (chronic): Secondary | ICD-10-CM

## 2022-01-10 DIAGNOSIS — D508 Other iron deficiency anemias: Secondary | ICD-10-CM | POA: Diagnosis not present

## 2022-01-10 MED ORDER — SODIUM CHLORIDE 0.9 % IV SOLN: Freq: Once | INTRAVENOUS | Status: AC

## 2022-01-10 MED ORDER — SODIUM CHLORIDE 0.9 % IV SOLN
200.0000 mg | Freq: Once | INTRAVENOUS | Status: AC
  Administered 2022-01-10: 200 mg via INTRAVENOUS
  Filled 2022-01-10: qty 200

## 2022-01-10 NOTE — Patient Instructions (Signed)

## 2022-01-16 ENCOUNTER — Other Ambulatory Visit: Payer: Self-pay | Admitting: Pharmacist

## 2022-01-17 ENCOUNTER — Ambulatory Visit: Payer: PRIVATE HEALTH INSURANCE

## 2022-01-22 ENCOUNTER — Inpatient Hospital Stay: Payer: Medicaid Other | Attending: Oncology

## 2022-01-22 VITALS — BP 121/64 | HR 71 | Temp 97.9°F | Resp 18 | Wt 251.0 lb

## 2022-01-22 DIAGNOSIS — D500 Iron deficiency anemia secondary to blood loss (chronic): Secondary | ICD-10-CM

## 2022-01-22 DIAGNOSIS — D508 Other iron deficiency anemias: Secondary | ICD-10-CM | POA: Insufficient documentation

## 2022-01-22 MED ORDER — SODIUM CHLORIDE 0.9 % IV SOLN: Freq: Once | INTRAVENOUS | Status: AC

## 2022-01-22 MED ORDER — SODIUM CHLORIDE 0.9 % IV SOLN
200.0000 mg | Freq: Once | INTRAVENOUS | Status: AC
  Administered 2022-01-22: 200 mg via INTRAVENOUS
  Filled 2022-01-22: qty 200

## 2022-01-22 NOTE — Patient Instructions (Signed)

## 2022-01-31 ENCOUNTER — Encounter: Payer: Self-pay | Admitting: Oncology

## 2022-02-13 NOTE — Progress Notes (Signed)
[facility]  [address] [place],  [place]  [address] [phone]  Clinic Day:  02/14/22  Referring physician: [facility]   ASSESSMENT & PLAN:   Iron deficiency anemia likely related to her menstrual periods. She did not tolerate oral iron supplement.  Her hemoglobin has improved quite a bit today after receiving IV iron, and is nearly normal. Repeat iron and B12 levels are pending, I will call her if abnormal.  2.   Low normal B12 of 213 at previous visit.  I recommended oral supplement.  Repeat B12 level came up to 740 with that.  This is a pleasant 31 year old female with iron deficiency anemia, related to her menstrual periods. She did not tolerate oral iron supplement, with decrease in her hemoglobin from 11.2 to 10.2. We gave her IV iron in the form of Venofer and she is feeling better and has responded well, with hemoglobin now 11.7, only 3 weeks after completion of IV iron.  For completeness, iron studies have been repeated and B12 has been repeated.   I recommend she return in 6 months with repeat CBC, iron, TIBC and ferritin.  She understands and agrees with this plan of care. I have answered her questions and she knows to call with any concerns.  I provided 15 minutes of face-to-face time during this this encounter and > 50% was spent counseling as documented under my assessment and plan.    Dellia Beckwith, MD [facility] AT [facility] [address] [place] [address] Dept: [phone] Dept Fax: [phone]    CHIEF COMPLAINT:  CC: Iron deficiency anemia  Current Treatment:  IV iron supplement   HISTORY OF PRESENT ILLNESS:  Alexandria Price is a 31 y.o. female referred by Mauricio Po, NP-C, for the evaluation and treatment of iron deficiency anemia. Lab evaluation from February 1st revealed a hemoglobin of 10.7 with an MCV of 79.6, and white count and platelets  were normal. Iron studies revealed an iron of 31 with a TIBC of 406 for a saturation of 8%.When I saw her 2 weeks later, her hemoglobin was 11.2 with an MCV of 79. She was recommended to start oral iron supplement and she has started this once daily.  She has regular menstrual periods which can occasionally be heavy.  She noted pica to ice.  She denied any other forms of bleeding but does complain of easy bruising.  She has had some abdominal pain and CT scan of abdomen and pelvis revealed tiny renal stones but no other abnormalities.  She did not tolerate the oral iron and so was given intravenous iron which was completed on Jan 22, 2022  INTERVAL HISTORY:  I have reviewed her chart and materials related to her anemia extensively and collaborated the history with the patient.  Oncology History   No history exists.    Alexandria Price states that she tolerated the IV iron and is feeling better.  She completed this just 3 weeks ago on May 1.  Her hemoglobin has come up nicely from 10.2 to 11.7, nearly normal. I had recommended that she take oral B12 supplement since her B12 level was low normal at 213, and it has come up to 740. Marland Kitchen Her  appetite is good, and she is eating well.  Her weight is up 1-1/2 pounds.  She denies any significant unintentional weight loss or gain.  She denies fever, chills or other signs of  infection.  She denies nausea, vomiting, bowel issues, or abdominal pain.  She denies sore throat, cough, dyspnea, or chest pain. HISTORY:   Past Medical History:  Diagnosis Date   Abnormal antenatal AFP screen    Diabetes mellitus without complication (HCC)    Kidney stone     Past Surgical History:  Procedure Laterality Date   CHOLECYSTECTOMY     FRACTURE SURGERY      Family History  Problem Relation Age of Onset   Diabetes Mother    Diabetes Maternal Uncle     Social History:  reports that she has never smoked. She has never used smokeless tobacco. She reports that she does not drink  alcohol and does not use drugs.The patient is alone today. She is married and lives at home with her spouse. She has 2 children. She works as a Scientist, physiological, and has never been exposed to chemicals or other toxic agents.  Allergies: No Known Allergies  Current Medications: No current outpatient medications on file.   No current facility-administered medications for this visit.    REVIEW OF SYSTEMS:  Review of Systems  Constitutional:  Positive for fatigue (chronic). Negative for appetite change, chills, fever and unexpected weight change.       Pica to ice  HENT:  Negative.    Eyes: Negative.   Respiratory: Negative.  Negative for chest tightness, cough, hemoptysis, shortness of breath and wheezing.   Cardiovascular: Negative.  Negative for chest pain, leg swelling and palpitations.  Gastrointestinal:  Negative for abdominal distention, abdominal pain, blood in stool, constipation, diarrhea, nausea and vomiting.  Endocrine: Negative.   Genitourinary: Negative.  Negative for difficulty urinating, dysuria, frequency and hematuria.   Musculoskeletal: Negative.  Negative for arthralgias, back pain, flank pain, gait problem and myalgias.  Skin: Negative.   Neurological: Negative.  Negative for dizziness, extremity weakness, gait problem, headaches, light-headedness, numbness, seizures and speech difficulty.  Hematological:  Bruises/bleeds easily.  Psychiatric/Behavioral: Negative.  Negative for depression and sleep disturbance. The patient is not nervous/anxious.      VITALS:  Blood pressure 129/80, pulse 77, temperature 98.1 F (36.7 C), temperature source Oral, resp. rate 18, height 5\' 2"  (1.575 m), weight 253 lb 9.6 oz (115 kg), SpO2 98 %, unknown if currently breastfeeding.  Wt Readings from Last 3 Encounters:  02/14/22 253 lb 9.6 oz (115 kg)  01/22/22 251 lb (113.9 kg)  01/03/22 252 lb 1.9 oz (114.4 kg)    Body mass index is 46.38 kg/m.  Performance status (ECOG): 1 -  Symptomatic but completely ambulatory  PHYSICAL EXAM:  Physical Exam Constitutional:      General: She is not in acute distress.    Appearance: Normal appearance. She is normal weight.  HENT:     Head: Normocephalic and atraumatic.  Eyes:     General: No scleral icterus.    Extraocular Movements: Extraocular movements intact.     Conjunctiva/sclera: Conjunctivae normal.     Pupils: Pupils are equal, round, and reactive to light.  Cardiovascular:     Rate and Rhythm: Normal rate and regular rhythm.     Pulses: Normal pulses.     Heart sounds: Normal heart sounds. No murmur heard.    No friction rub. No gallop.  Pulmonary:     Effort: Pulmonary effort is normal. No respiratory distress.     Breath sounds: Normal breath sounds.  Abdominal:     General: Bowel sounds are normal. There is no distension.     Palpations: Abdomen  is soft. There is no hepatomegaly, splenomegaly or mass.     Tenderness: There is no abdominal tenderness (mild).  Musculoskeletal:        General: Normal range of motion.     Cervical back: Normal range of motion and neck supple.     Right lower leg: No edema.     Left lower leg: No edema.  Lymphadenopathy:     Cervical: No cervical adenopathy.  Skin:    General: Skin is warm and dry.  Neurological:     General: No focal deficit present.     Mental Status: She is alert and oriented to person, place, and time. Mental status is at baseline.  Psychiatric:        Mood and Affect: Mood normal.        Behavior: Behavior normal.        Thought Content: Thought content normal.        Judgment: Judgment normal.     LABS:      Latest Ref Rng & Units 02/14/2022   12:00 AM 12/18/2021   12:00 AM 11/06/2021   12:00 AM  CBC  WBC  10.2     9.7     10.8   Hemoglobin 12.0 - 16.0 11.7     10.2     11.2   Hematocrit 36 - 46 37     32     35   Platelets 150 - 400 K/uL 270     307     293      This result is from an external source.      Latest Ref Rng & Units  01/09/2021    4:18 PM  CMP  Glucose 70 - 99 mg/dL 793   BUN 6 - 20 mg/dL 8   Creatinine 9.03 - 0.09 mg/dL 2.33   Sodium 007 - 622 mmol/L 136   Potassium 3.5 - 5.1 mmol/L 3.7   Chloride 98 - 111 mmol/L 104   CO2 22 - 32 mmol/L 24   Calcium 8.9 - 10.3 mg/dL 9.2   Total Protein 6.5 - 8.1 g/dL 8.0   Total Bilirubin 0.3 - 1.2 mg/dL 0.2   Alkaline Phos 38 - 126 U/L 66   AST 15 - 41 U/L 17   ALT 0 - 44 U/L 18     Lab Results  Component Value Date   TIBC 372 02/14/2022   TIBC 378 12/18/2021   TIBC 431 11/06/2021   FERRITIN 28 12/18/2021   FERRITIN 14 11/06/2021   IRONPCTSAT 22 02/14/2022   IRONPCTSAT 19 12/18/2021   IRONPCTSAT 8 (L) 11/06/2021   Lab Results  Component Value Date   LDH 115 11/06/2021    STUDIES:  No results found.     EXAM: 10/26/2021 CT ABDOMEN AND PELVIS WITHOUT CONTRAST   TECHNIQUE:  Multidetector CT imaging of the abdomen and pelvis was performed  following the standard protocol without IV contrast.  RADIATION DOSE REDUCTION: This exam was performed according to the  departmental dose-optimization program which includes automated  exposure control, adjustment of the mA and/or kV according to  patient size and/or use of iterative reconstruction technique.   COMPARISON: 01/09/2021   FINDINGS:  Lower chest: Unremarkable.  Hepatobiliary: Liver is enlarged measuring 19.5 cm in length. No  focal abnormality is seen. Surgical clips are seen in gallbladder  fossa.  Pancreas: No focal abnormality is seen.  Spleen: Unremarkable.  Adrenals/Urinary Tract: Adrenals are not enlarged. There is no  hydronephrosis.  In the coronal reconstruction images there are few  tiny 1 mm calculi in the left kidney. There is possible sub mm  calcific density in the lower pole of right kidney. There are no  ureteral stones. Urinary bladder is not distended.  Stomach/Bowel: Stomach is unremarkable. Small bowel loops are not  dilated. Appendix is not distinctly seen. There  is no evidence of  dilated appendix in the pericecal region. There is no focal  pericecal inflammation. There is no significant wall thickening in  colon.  Vascular/Lymphatic: Unremarkable.  Reproductive: Micro inserts are noted suggesting previous Essure  placement.  Other: There is no ascites or pneumoperitoneum. Small umbilical  hernia containing fat is seen.  Musculoskeletal: Unremarkable.   IMPRESSION:  There is no evidence intestinal obstruction or pneumoperitoneum.  There is no hydronephrosis. There are possible tiny submillimeter  renal stones.

## 2022-02-14 ENCOUNTER — Encounter: Payer: Self-pay | Admitting: Oncology

## 2022-02-14 ENCOUNTER — Other Ambulatory Visit: Payer: Self-pay | Admitting: Oncology

## 2022-02-14 ENCOUNTER — Telehealth: Payer: Self-pay | Admitting: Oncology

## 2022-02-14 ENCOUNTER — Inpatient Hospital Stay: Payer: Medicaid Other

## 2022-02-14 ENCOUNTER — Inpatient Hospital Stay (INDEPENDENT_AMBULATORY_CARE_PROVIDER_SITE_OTHER): Payer: Medicaid Other | Admitting: Oncology

## 2022-02-14 VITALS — BP 129/80 | HR 77 | Temp 98.1°F | Resp 18 | Ht 62.0 in | Wt 253.6 lb

## 2022-02-14 DIAGNOSIS — D500 Iron deficiency anemia secondary to blood loss (chronic): Secondary | ICD-10-CM

## 2022-02-14 DIAGNOSIS — D539 Nutritional anemia, unspecified: Secondary | ICD-10-CM

## 2022-02-14 DIAGNOSIS — D508 Other iron deficiency anemias: Secondary | ICD-10-CM | POA: Diagnosis not present

## 2022-02-14 LAB — IRON AND TIBC
Iron: 81 ug/dL (ref 28–170)
Saturation Ratios: 22 % (ref 10.4–31.8)
TIBC: 372 ug/dL (ref 250–450)
UIBC: 291 ug/dL

## 2022-02-14 LAB — CBC AND DIFFERENTIAL
HCT: 37 (ref 36–46)
Hemoglobin: 11.7 — AB (ref 12.0–16.0)
Neutrophils Absolute: 7.45
Platelets: 270 10*3/uL (ref 150–400)
WBC: 10.2

## 2022-02-14 LAB — VITAMIN B12: Vitamin B-12: 311 pg/mL (ref 180–914)

## 2022-02-14 LAB — CBC: RBC: 4.35 (ref 3.87–5.11)

## 2022-02-14 NOTE — Telephone Encounter (Signed)
Per 02/14/22 los next appt scheduled and confirmed with patient 

## 2022-03-03 ENCOUNTER — Other Ambulatory Visit: Payer: Self-pay | Admitting: Oncology

## 2022-03-03 ENCOUNTER — Encounter: Payer: Self-pay | Admitting: Oncology

## 2022-03-03 DIAGNOSIS — D539 Nutritional anemia, unspecified: Secondary | ICD-10-CM

## 2022-08-10 ENCOUNTER — Other Ambulatory Visit: Payer: Self-pay | Admitting: Hematology and Oncology

## 2022-08-10 DIAGNOSIS — D500 Iron deficiency anemia secondary to blood loss (chronic): Secondary | ICD-10-CM

## 2022-08-13 ENCOUNTER — Other Ambulatory Visit: Payer: Self-pay

## 2022-08-15 ENCOUNTER — Inpatient Hospital Stay: Payer: Medicaid Other

## 2022-08-15 ENCOUNTER — Encounter: Payer: Self-pay | Admitting: Hematology and Oncology

## 2022-08-15 ENCOUNTER — Inpatient Hospital Stay: Payer: Medicaid Other | Attending: Hematology and Oncology | Admitting: Hematology and Oncology

## 2022-08-15 ENCOUNTER — Telehealth: Payer: Self-pay

## 2022-08-15 VITALS — BP 127/73 | HR 72 | Temp 98.1°F | Resp 18 | Ht 62.0 in | Wt 245.7 lb

## 2022-08-15 DIAGNOSIS — N920 Excessive and frequent menstruation with regular cycle: Secondary | ICD-10-CM | POA: Diagnosis present

## 2022-08-15 DIAGNOSIS — Z79899 Other long term (current) drug therapy: Secondary | ICD-10-CM | POA: Insufficient documentation

## 2022-08-15 DIAGNOSIS — E538 Deficiency of other specified B group vitamins: Secondary | ICD-10-CM | POA: Diagnosis not present

## 2022-08-15 DIAGNOSIS — D539 Nutritional anemia, unspecified: Secondary | ICD-10-CM

## 2022-08-15 DIAGNOSIS — D500 Iron deficiency anemia secondary to blood loss (chronic): Secondary | ICD-10-CM | POA: Insufficient documentation

## 2022-08-15 HISTORY — DX: Deficiency of other specified B group vitamins: E53.8

## 2022-08-15 LAB — CBC AND DIFFERENTIAL
HCT: 34 — AB (ref 36–46)
Hemoglobin: 11.6 — AB (ref 12.0–16.0)
MCV: 86 (ref 81–99)
Neutrophils Absolute: 7.24
Platelets: 256 10*3/uL (ref 150–400)
WBC: 10.2

## 2022-08-15 LAB — BASIC METABOLIC PANEL
BUN: 11 (ref 4–21)
CO2: 24 — AB (ref 13–22)
Chloride: 105 (ref 99–108)
Creatinine: 0.6 (ref 0.5–1.1)
Glucose: 134
Potassium: 4.1 mEq/L (ref 3.5–5.1)
Sodium: 139 (ref 137–147)

## 2022-08-15 LAB — COMPREHENSIVE METABOLIC PANEL
Albumin: 4.3 (ref 3.5–5.0)
Calcium: 9.5 (ref 8.7–10.7)

## 2022-08-15 LAB — IRON AND TIBC
Iron: 44 ug/dL (ref 28–170)
Saturation Ratios: 13 % (ref 10.4–31.8)
TIBC: 330 ug/dL (ref 250–450)
UIBC: 286 ug/dL

## 2022-08-15 LAB — FERRITIN: Ferritin: 90 ng/mL (ref 11–307)

## 2022-08-15 LAB — HEPATIC FUNCTION PANEL
ALT: 18 U/L (ref 7–35)
AST: 23 (ref 13–35)
Alkaline Phosphatase: 71 (ref 25–125)
Bilirubin, Total: 0.5

## 2022-08-15 LAB — CBC: RBC: 3.99 (ref 3.87–5.11)

## 2022-08-15 LAB — VITAMIN B12: Vitamin B-12: 185 pg/mL (ref 180–914)

## 2022-08-15 NOTE — Telephone Encounter (Signed)
-----   Message from Adah Perl, PA-C sent at 08/15/2022  1:20 PM EST ----- Please let her know iron levels are fine, but her B12 is low again. I recommended taking oral B12 1000 mcg daily until we see her again. Thank you

## 2022-08-15 NOTE — Assessment & Plan Note (Signed)
She has recurrent B12 deficiency since stopping oral B12.  We know from previous that she is absorbing B12 when she takes it, so I recommended she start back on B12 1000 mcg daily.

## 2022-08-15 NOTE — Progress Notes (Signed)
[name],  [place]  [address] [phone]  Clinic Day:  08/15/2022  Referring physician: Erskine Emery, NP  ASSESSMENT & PLAN:   Assessment & Plan: Iron deficiency anemia due to chronic blood loss Iron deficiency anemia felt to be related to heavy menses. She did not tolerate oral iron supplement, so was given IV iron and March so was given IV iron in March with improvement in her hemoglobin and iron studies.  She has been more fatigued recently, but her hemoglobin is stable at 11.6.  Iron studies and B12 are pending from today and we will plan to contact her with these results.  If these are normal, we will plan to see her back in 6 months for repeat clinical assessment.  B12 deficiency She has recurrent B12 deficiency since stopping oral B12.  We know from previous that she is absorbing B12 when she takes it, so I recommended she start back on B12 1000 mcg daily.   The patient understands the plans discussed today and is in agreement with them.  She knows to contact our office if she develops concerns prior to her next appointment.     Adah Perl, PA-C  [facility] AT [facility] [address] [place] [address] Dept: [phone] Dept Fax: [phone]   Orders Placed This Encounter  Procedures   CBC with Differential (Cancer Center Only)    Standing Status:   Future    Standing Expiration Date:   08/16/2023   Ferritin    Standing Status:   Future    Standing Expiration Date:   08/16/2023   Iron and TIBC    Standing Status:   Future    Standing Expiration Date:   08/16/2023   Vitamin B12    Standing Status:   Future    Standing Expiration Date:   08/16/2023   CBC and differential    This external order was created through the Results Console.   CBC    This external order was created through the Results Console.   CBC (Cancer Center Only)   CMP  (Cancer Center only)   Differential   Basic metabolic panel    This external order was created through the Results Console.   Comprehensive metabolic panel    This external order was created through the Results Console.   Hepatic function panel    This external order was created through the Results Console.      CHIEF COMPLAINT:  CC: Iron deficiency anemia  Current Treatment: Observation  HISTORY OF PRESENT ILLNESS:  Alexandria Price is a 31 y.o. female with iron deficiency anemia who we began seeing in February. Lab evaluation at her primary care provider's office in February revealed a hemoglobin of 10.7 with an MCV of 79.6, and white count and platelets were normal. Iron studies revealed an iron of 31 with a TIBC of 406 for a saturation of 8%, which is consistent with iron deficiency. She was started on oral iron supplement daily.  She had improvement of her hemoglobin at her consultation later in February.  She reported regular menstrual periods which which were occasionally heavy.  CT scan of abdomen and pelvis in February done due to abdominal pain revealed tiny renal stones.  She has a history of kidney stones, but has not had difficulty passing any in 8 years.  She was also found to have a low normal B12 and  started on oral B12 1000 mcg daily with improvement in her B12 level in March.  She did not tolerate the oral iron, so was given intravenous iron in March.  She had normalization of her iron studies and near normalization of her hemoglobin after receiving iron.  INTERVAL HISTORY:  Alexandria Price is here today for repeat clinical assessment and states she has been very fatigued for the past month.  She states her appetite is decreased and she is only eating 2 meals a day.  She denies gastrointestinal symptoms.  She states she gets lightheaded when standing for extended.  Some time.  She reports occasional shortness of breath with exertion.  She denies chest pain or palpitations.  She had her  menses about 2 weeks ago and states that the last 2 cycles were 6 weeks apart.  The most recent 1 was lighter than normal, but she continues to usually have heavy days once a month.  She states she stopped taking B12 about 1 month ago.  Since her last visit, she was evaluated in the emergency room in August for right flank pain.  CT and pelvis revealed possible tiny bilateral renal calculi as before, but no other abnormality.  She has seen Prescilla Sours, NP, regarding her kidney stones.  She states she is on tamsulosin to help prevent kidney stones.  She denies fevers or chills. She denies pain. Her appetite is good. Her weight has decreased 8 pounds over last 6 months .  I questioned her about other symptoms of depression, since she has decreased appetite difficulty sleeping and fatigue.  She denies other symptoms of depression.  She states she is quite satisfied with her relationship with her husband and children, as well as her job.  She denies extreme stressors.  I recommended she follow-up with her primary care provider if this persists.  REVIEW OF SYSTEMS:  Review of Systems  Constitutional:  Positive for appetite change and unexpected weight change. Negative for chills, fatigue and fever.  HENT:   Negative for lump/mass, mouth sores and sore throat.   Respiratory:  Positive for shortness of breath. Negative for cough.   Cardiovascular:  Negative for chest pain and leg swelling.  Gastrointestinal:  Negative for abdominal pain, constipation, diarrhea, nausea and vomiting.  Endocrine: Negative for hot flashes.  Genitourinary:  Positive for menstrual problem. Negative for difficulty urinating, dysuria, frequency and hematuria.   Musculoskeletal:  Negative for arthralgias, back pain and myalgias.  Skin:  Negative for rash.  Neurological:  Positive for light-headedness. Negative for dizziness and headaches.  Hematological:  Negative for adenopathy. Does not bruise/bleed easily.   Psychiatric/Behavioral:  Negative for depression and sleep disturbance. The patient is not nervous/anxious.      VITALS:  Blood pressure 127/73, pulse 72, temperature 98.1 F (36.7 C), temperature source Oral, resp. rate 18, height 5\' 2"  (1.575 m), weight 245 lb 11.2 oz (111.4 kg), SpO2 98 %, unknown if currently breastfeeding.  Wt Readings from Last 3 Encounters:  08/15/22 245 lb 11.2 oz (111.4 kg)  02/14/22 253 lb 9.6 oz (115 kg)  01/22/22 251 lb (113.9 kg)    Body mass index is 44.94 kg/m.  Performance status (ECOG): 1 - Symptomatic but completely ambulatory  PHYSICAL EXAM:  Physical Exam Vitals and nursing note reviewed.  Constitutional:      General: She is not in acute distress.    Appearance: Normal appearance.  HENT:     Head: Normocephalic and atraumatic.     Mouth/Throat:  Mouth: Mucous membranes are moist.     Pharynx: Oropharynx is clear. No oropharyngeal exudate or posterior oropharyngeal erythema.  Eyes:     General: No scleral icterus.    Extraocular Movements: Extraocular movements intact.     Conjunctiva/sclera: Conjunctivae normal.     Pupils: Pupils are equal, round, and reactive to light.  Cardiovascular:     Rate and Rhythm: Normal rate and regular rhythm.     Heart sounds: Normal heart sounds. No murmur heard.    No friction rub. No gallop.  Pulmonary:     Effort: Pulmonary effort is normal.     Breath sounds: Normal breath sounds. No wheezing, rhonchi or rales.  Abdominal:     General: There is no distension.     Palpations: Abdomen is soft. There is no hepatomegaly, splenomegaly or mass.     Tenderness: There is no abdominal tenderness.  Musculoskeletal:        General: Normal range of motion.     Cervical back: Normal range of motion and neck supple. No tenderness.     Right lower leg: No edema.     Left lower leg: No edema.  Lymphadenopathy:     Cervical: No cervical adenopathy.     Upper Body:     Right upper body: No supraclavicular  or axillary adenopathy.     Left upper body: No supraclavicular or axillary adenopathy.     Lower Body: No right inguinal adenopathy. No left inguinal adenopathy.  Skin:    General: Skin is warm and dry.     Coloration: Skin is not jaundiced.     Findings: No rash.  Neurological:     Mental Status: She is alert and oriented to person, place, and time.     Cranial Nerves: No cranial nerve deficit.  Psychiatric:        Mood and Affect: Mood normal.        Behavior: Behavior normal.        Thought Content: Thought content normal.     LABS:      Latest Ref Rng & Units 08/15/2022   12:00 AM 02/14/2022   12:00 AM 12/18/2021   12:00 AM  CBC  WBC  10.2     10.2     9.7      Hemoglobin 12.0 - 16.0 11.6     11.7     10.2      Hematocrit 36 - 46 34     37     32      Platelets 150 - 400 K/uL 256     270     307         This result is from an external source.      Latest Ref Rng & Units 08/15/2022   12:00 AM 01/09/2021    4:18 PM  CMP  Glucose 70 - 99 mg/dL  762   BUN 4 - 21 11     8    Creatinine 0.5 - 1.1 0.6     0.79   Sodium 137 - 147 139     136   Potassium 3.5 - 5.1 mEq/L 4.1     3.7   Chloride 99 - 108 105     104   CO2 13 - 22 24     24    Calcium 8.7 - 10.7 9.5     9.2   Total Protein 6.5 - 8.1 g/dL  8.0   Total Bilirubin 0.3 -  1.2 mg/dL  0.2   Alkaline Phos 25 - 125 71     66   AST 13 - 35 23     17   ALT 7 - 35 U/L 18     18      This result is from an external source.     No results found for: "CEA1", "CEA" / No results found for: "CEA1", "CEA" No results found for: "PSA1" No results found for: "ZOX096CAN199" No results found for: "CAN125"  No results found for: "TOTALPROTELP", "ALBUMINELP", "A1GS", "A2GS", "BETS", "BETA2SER", "GAMS", "MSPIKE", "SPEI" Lab Results  Component Value Date   TIBC 330 08/15/2022   TIBC 372 02/14/2022   TIBC 378 12/18/2021   FERRITIN 90 08/15/2022   FERRITIN 28 12/18/2021   FERRITIN 14 11/06/2021   IRONPCTSAT 13 08/15/2022    IRONPCTSAT 22 02/14/2022   IRONPCTSAT 19 12/18/2021   Lab Results  Component Value Date   LDH 115 11/06/2021    STUDIES:  No results found.    HISTORY:   Past Medical History:  Diagnosis Date   Abnormal antenatal AFP screen    B12 deficiency 08/15/2022   Diabetes mellitus without complication (HCC)    Kidney stone     Past Surgical History:  Procedure Laterality Date   CHOLECYSTECTOMY     FRACTURE SURGERY      Family History  Problem Relation Age of Onset   Diabetes Mother    Diabetes Maternal Uncle     Social History:  reports that she has never smoked. She has never used smokeless tobacco. She reports that she does not drink alcohol and does not use drugs.The patient is alone today.  Allergies: No Known Allergies  Current Medications: Current Outpatient Medications  Medication Sig Dispense Refill   tamsulosin (FLOMAX) 0.4 MG CAPS capsule SMARTSIG:1 Capsule(s) By Mouth Every Evening     No current facility-administered medications for this visit.

## 2022-08-15 NOTE — Assessment & Plan Note (Addendum)
Iron deficiency anemia felt to be related to heavy menses. She did not tolerate oral iron supplement, so was given IV iron and March so was given IV iron in March with improvement in her hemoglobin and iron studies.  She has been more fatigued recently, but her hemoglobin is stable at 11.6.  Iron studies and B12 are pending from today and we will plan to contact her with these results.  If these are normal, we will plan to see her back in 6 months for repeat clinical assessment.

## 2022-08-15 NOTE — Telephone Encounter (Signed)
Patient notified and voiced understanding.

## 2023-02-06 IMAGING — CT CT RENAL STONE PROTOCOL
2 of 4 series · 16 of 46 positions shown, 18 images · non-contrast
Comparison: CT abdomen pelvis 09/05/2020

CLINICAL DATA: Bilateral flank pain.

EXAM:
CT ABDOMEN AND PELVIS WITHOUT CONTRAST
TECHNIQUE: Multidetector CT imaging of the abdomen and pelvis was performed
following the standard protocol without IV contrast.

[Series 3: ap without · axial · non-contrast · 0.96mm/px · z∈[+896,+1351]mm · 13 of 103 slices shown, 15 images]
[im 6/103  soft-tissue]
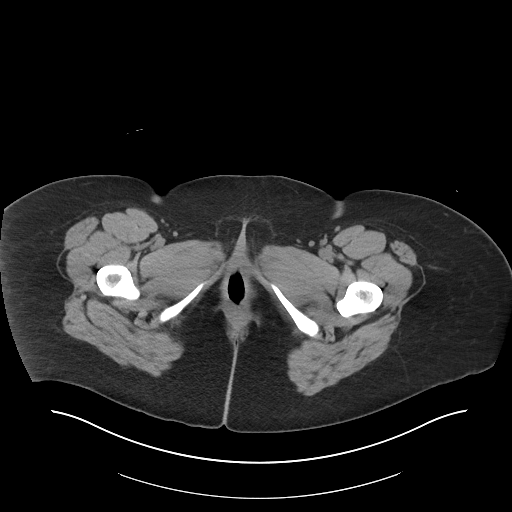
[im 6/103  bone]
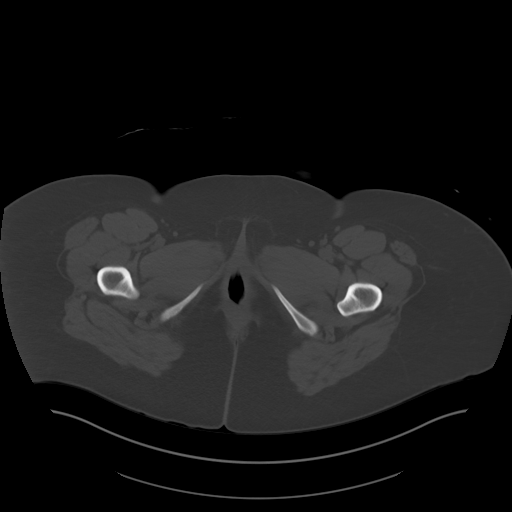
[im 12/103  soft-tissue]
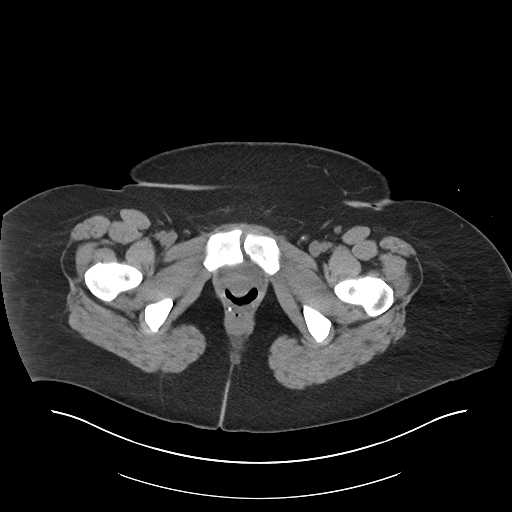
[im 23/103  soft-tissue]
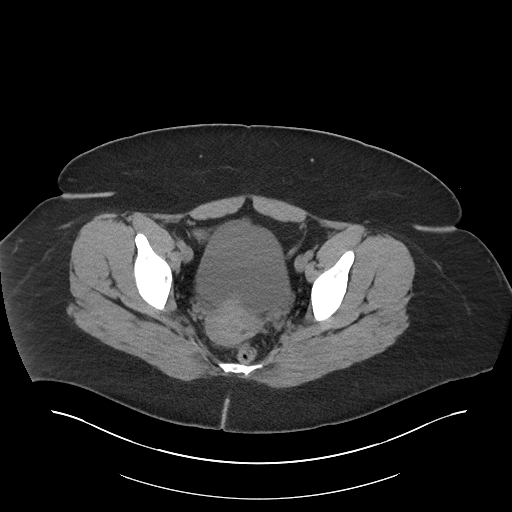
[im 29/103  soft-tissue]
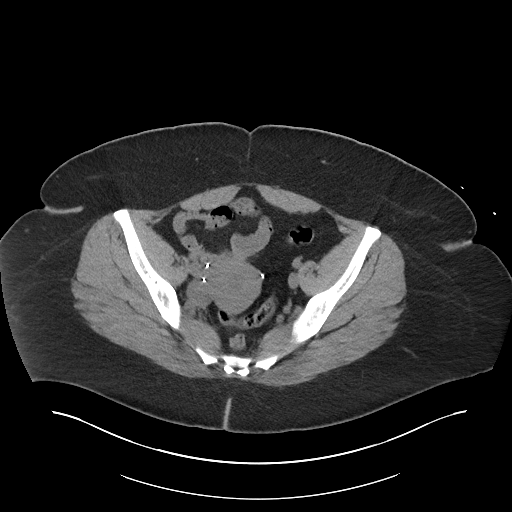
[im 35/103  soft-tissue]
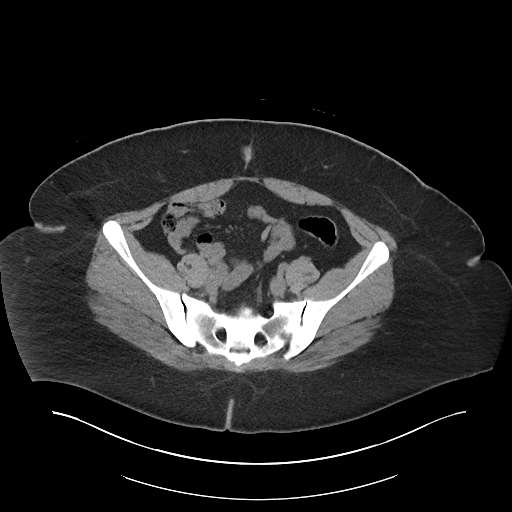
[im 46/103  soft-tissue]
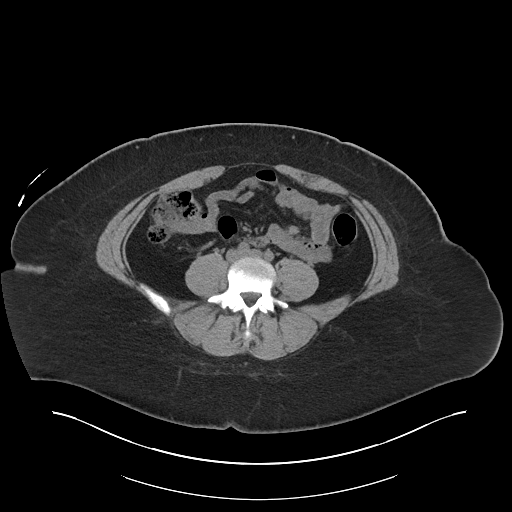
[im 52/103  soft-tissue]
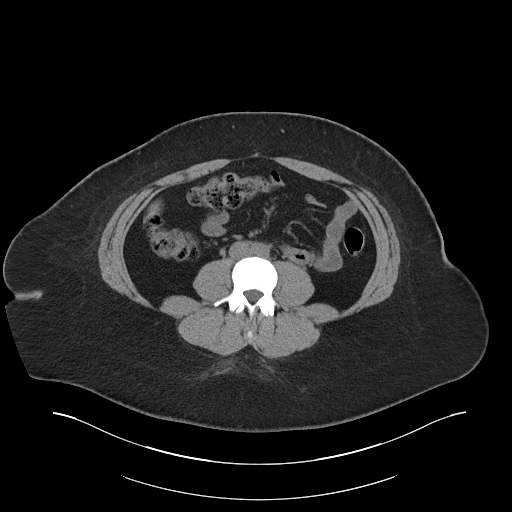
[im 57/103  soft-tissue]
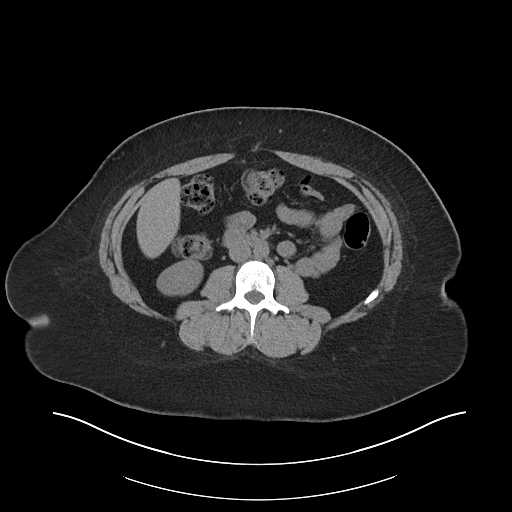
[im 69/103  soft-tissue]
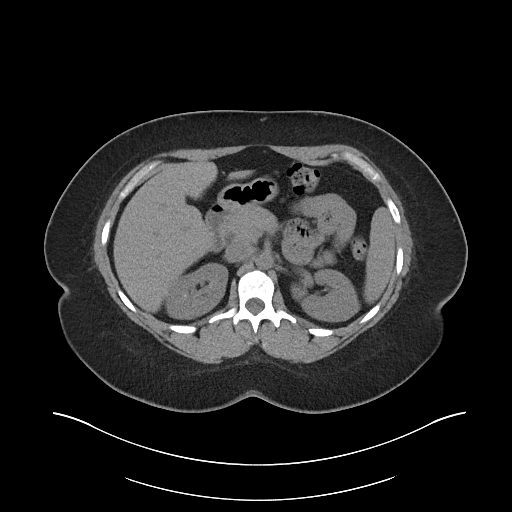
[im 69/103  bone]
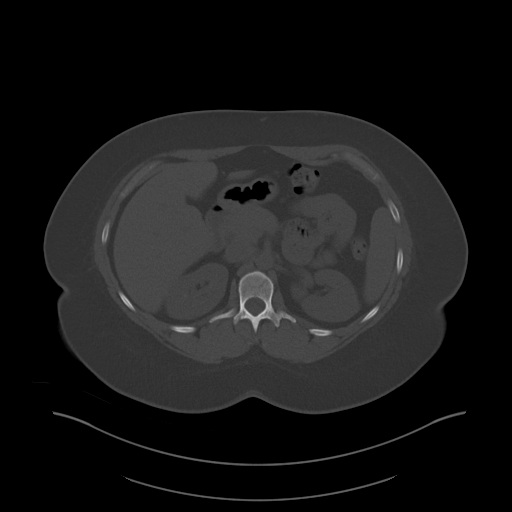
[im 74/103  soft-tissue]
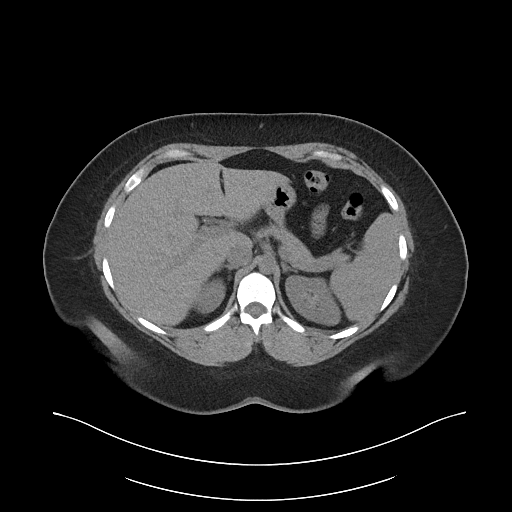
[im 80/103  soft-tissue]
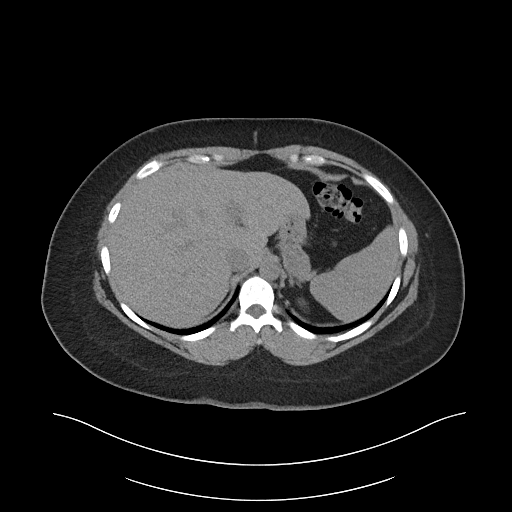
[im 91/103  soft-tissue]
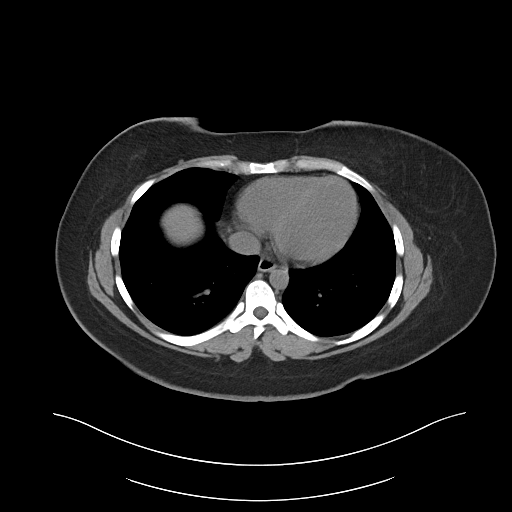
[im 97/103  soft-tissue]
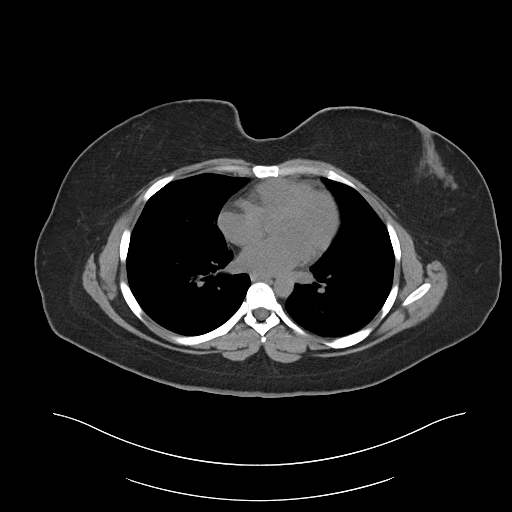

[Series 6: cor · coronal · 0.94mm/px · 3 of 103 slices shown]
[im 35/103  soft-tissue]
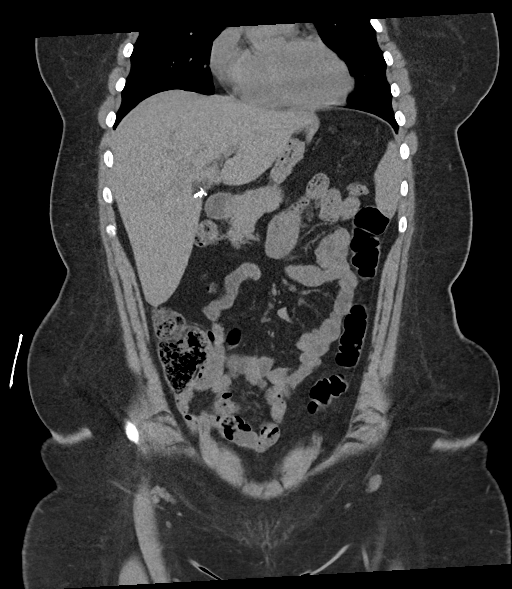
[im 46/103  soft-tissue]
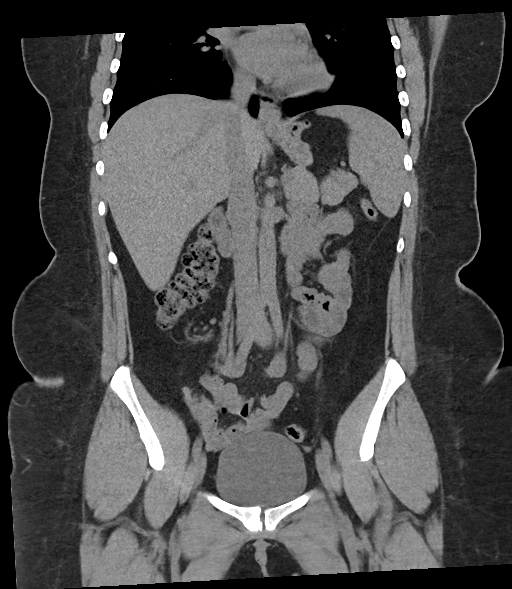
[im 57/103  soft-tissue]
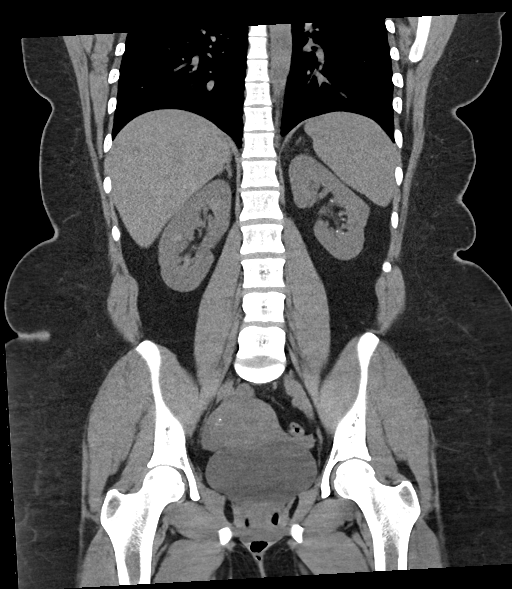

[16 of 46 positions shown; findings below may reference images not displayed]

FINDINGS: Lower chest: No acute abnormality.

Hepatobiliary: No focal liver abnormality is seen. Status post
cholecystectomy. No biliary dilatation.

Pancreas: No focal lesion. Normal pancreatic contour. No surrounding
inflammatory changes. No main pancreatic ductal dilatation.

Spleen: Normal in size without focal abnormality.

Adrenals/Urinary Tract:

No adrenal nodule bilaterally.

Few 1 mm calcified stones within the left kidney. No right
nephrolithiasis. No hydronephrosis. No contour-deforming renal mass.
No ureterolithiasis or hydroureter.

The urinary bladder is unremarkable.

Stomach/Bowel: Stomach is within normal limits. No evidence of bowel
wall thickening or dilatation. Appendix appears normal.

Vascular/Lymphatic: No significant vascular findings are present. No
enlarged abdominal or pelvic lymph nodes.

Reproductive: Findings suggestive of tubal ligation bilaterally.
Uterus and bilateral adnexa are unremarkable.

Other: No intraperitoneal free fluid. No intraperitoneal free gas.
No organized fluid collection.

Musculoskeletal: No acute or significant osseous findings.
IMPRESSION: 1. Nonobstructive punctate left nephrolithiasis.
2. Otherwise no acute intra-abdominal intrapelvic abnormality in a
patient status post cholecystectomy and likely bilateral tubal
ligation. Please note limited evaluation on this noncontrast study.

## 2023-02-13 ENCOUNTER — Inpatient Hospital Stay: Payer: Medicaid Other

## 2023-02-13 ENCOUNTER — Inpatient Hospital Stay: Payer: Medicaid Other | Attending: Hematology and Oncology | Admitting: Hematology and Oncology

## 2023-02-13 NOTE — Assessment & Plan Note (Deleted)
She continues oral B12 supplementation daily.

## 2023-02-13 NOTE — Progress Notes (Deleted)
[name]  [address] [place],  [place]  [address] [phone]  Clinic Day:  02/13/2023  Referring physician: Erskine Emery, NP  ASSESSMENT & PLAN:   Assessment & Plan: Iron deficiency anemia due to chronic blood loss Iron deficiency anemia felt to be related to heavy menses. She did not tolerate oral iron supplement, so was given IV iron in the form of Venofer in March 2023.  She had with improvement in her hemoglobin and iron stores.  Iron studies and B12 are pending from today and we will plan to contact her with these results.  If these are normal, we will plan to see her back in 6 months for repeat clinical assessment.  B12 deficiency She continues oral B12 supplementation daily.    The patient understands the plans discussed today and is in agreement with them.  She knows to contact our office if she develops concerns prior to her next appointment.   I provided *** minutes of face-to-face time during this encounter and > 50% was spent counseling as documented under my assessment and plan.    Alexandria Perl, PA-C  [facility] [address] [place] [address] Dept: [phone] Dept Fax: [phone]   No orders of the defined types were placed in this encounter.     CHIEF COMPLAINT:  CC: Iron deficiency/B12 deficency  Current Treatment:  Oral B12  HISTORY OF PRESENT ILLNESS:  Alexandria Price is a 32 y.o. female with iron deficiency anemia who we began seeing in February. Lab evaluation at her primary care provider's office in February revealed a hemoglobin of 10.7 with an MCV of 79.6, and white count and platelets were normal. Iron studies revealed an iron of 31 with a TIBC of 406 for a saturation of 8%, which is consistent with iron deficiency. She was started on oral iron supplement daily.  CT scan of abdomen and pelvis in February done due to abdominal pain  revealed tiny renal stones. She was also found to have a low normal B12 and started on oral B12 1000 mcg daily with improvement in her B12 level in March 2023. She did not tolerate the oral iron, so was given intravenous iron in March.  She had normalization of her iron stores and near normalization of her hemoglobin after receiving IV iron. At her visit in November, she had stopped oral B12 and had recurrent B12 deficiency, so was placed back on oral B12.  INTERVAL HISTORY:  Alexandria Price is here today for repeat clinical assessment. She denies fevers or chills. She denies pain. Her appetite is good. Her weight {Weight change:10426}.  REVIEW OF SYSTEMS:  Review of Systems - Oncology   VITALS:  unknown if currently breastfeeding.  Wt Readings from Last 3 Encounters:  08/15/22 245 lb 11.2 oz (111.4 kg)  02/14/22 253 lb 9.6 oz (115 kg)  01/22/22 251 lb (113.9 kg)    There is no height or weight on file to calculate BMI.  Performance status (ECOG): {CHL ONC [id number]  PHYSICAL EXAM:  Physical Exam  LABS:      Latest Ref Rng & Units 08/15/2022   12:00 AM 02/14/2022   12:00 AM 12/18/2021   12:00 AM  CBC  WBC  10.2     10.2     9.7      Hemoglobin 12.0 - 16.0 11.6     11.7     10.2  Hematocrit 36 - 46 34     37     32      Platelets 150 - 400 K/uL 256     270     307         This result is from an external source.      Latest Ref Rng & Units 08/15/2022   12:00 AM 01/09/2021    4:18 PM  CMP  Glucose 70 - 99 mg/dL  528   BUN 4 - 21 11     8    Creatinine 0.5 - 1.1 0.6     0.79   Sodium 137 - 147 139     136   Potassium 3.5 - 5.1 mEq/L 4.1     3.7   Chloride 99 - 108 105     104   CO2 13 - 22 24     24    Calcium 8.7 - 10.7 9.5     9.2   Total Protein 6.5 - 8.1 g/dL  8.0   Total Bilirubin 0.3 - 1.2 mg/dL  0.2   Alkaline Phos 25 - 125 71     66   AST 13 - 35 23     17   ALT 7 - 35 U/L 18     18      This result is from an external source.     No results found for:  "CEA1", "CEA" / No results found for: "CEA1", "CEA" No results found for: "PSA1" No results found for: "UXL244" No results found for: "CAN125"  No results found for: "TOTALPROTELP", "ALBUMINELP", "A1GS", "A2GS", "BETS", "BETA2SER", "GAMS", "MSPIKE", "SPEI" Lab Results  Component Value Date   TIBC 330 08/15/2022   TIBC 372 02/14/2022   TIBC 378 12/18/2021   FERRITIN 90 08/15/2022   FERRITIN 28 12/18/2021   FERRITIN 14 11/06/2021   IRONPCTSAT 13 08/15/2022   IRONPCTSAT 22 02/14/2022   IRONPCTSAT 19 12/18/2021   Lab Results  Component Value Date   LDH 115 11/06/2021    STUDIES:  No results found.    HISTORY:   Past Medical History:  Diagnosis Date   Abnormal antenatal AFP screen    B12 deficiency 08/15/2022   Diabetes mellitus without complication (HCC)    Kidney stone     Past Surgical History:  Procedure Laterality Date   CHOLECYSTECTOMY     FRACTURE SURGERY      Family History  Problem Relation Age of Onset   Diabetes Mother    Diabetes Maternal Uncle     Social History:  reports that she has never smoked. She has never used smokeless tobacco. She reports that she does not drink alcohol and does not use drugs.The patient is {Blank single:19197::"alone","accompanied by"} *** today.  Allergies: No Known Allergies  Current Medications: Current Outpatient Medications  Medication Sig Dispense Refill   tamsulosin (FLOMAX) 0.4 MG CAPS capsule SMARTSIG:1 Capsule(s) By Mouth Every Evening     No current facility-administered medications for this visit.

## 2023-02-13 NOTE — Assessment & Plan Note (Deleted)
Iron deficiency anemia felt to be related to heavy menses. She did not tolerate oral iron supplement, so was given IV iron in the form of Venofer in March 2023.  She had with improvement in her hemoglobin and iron stores.  Iron studies and B12 are pending from today and we will plan to contact her with these results.  If these are normal, we will plan to see her back in 6 months for repeat clinical assessment.
